# Patient Record
Sex: Male | Born: 1950 | Race: Black or African American | Hispanic: No | Marital: Married | Smoking: Never smoker
Health system: Southern US, Community
[De-identification: ages and names within clinical notes are randomized; demographics above are authoritative.]

## PROBLEM LIST (undated history)

## (undated) DIAGNOSIS — I10 Essential (primary) hypertension: Secondary | ICD-10-CM

---

## 2004-01-03 ENCOUNTER — Emergency Department (HOSPITAL_COMMUNITY): Admission: EM | Admit: 2004-01-03 | Discharge: 2004-01-03 | Payer: Self-pay | Admitting: Emergency Medicine

## 2004-07-27 ENCOUNTER — Ambulatory Visit: Payer: Self-pay | Admitting: Family Medicine

## 2004-08-24 ENCOUNTER — Ambulatory Visit: Payer: Self-pay | Admitting: Family Medicine

## 2004-09-21 ENCOUNTER — Ambulatory Visit: Payer: Self-pay | Admitting: Family Medicine

## 2004-10-23 ENCOUNTER — Ambulatory Visit: Payer: Self-pay | Admitting: Family Medicine

## 2004-11-30 ENCOUNTER — Ambulatory Visit: Payer: Self-pay | Admitting: Family Medicine

## 2004-12-14 ENCOUNTER — Ambulatory Visit: Payer: Self-pay | Admitting: Family Medicine

## 2005-02-01 ENCOUNTER — Ambulatory Visit: Payer: Self-pay | Admitting: Family Medicine

## 2005-11-14 ENCOUNTER — Ambulatory Visit: Payer: Self-pay | Admitting: Internal Medicine

## 2005-11-14 LAB — CONVERTED CEMR LAB
ALT: 21 units/L
AST: 21 units/L
Alkaline Phosphatase: 85 units/L
CO2: 23 meq/L
Cholesterol: 152 mg/dL
Creatinine, Ser: 0.81 mg/dL
Eosinophils Absolute: 0.1 10*3/uL
Eosinophils Relative: 1 %
HCT: 43.7 %
Lymphs Abs: 2.1 10*3/uL
MCV: 85.2 fL
Platelets: 263 10*3/uL
RBC count: 5.13 10*6/uL
Total Bilirubin: 0.3 mg/dL
Total CHOL/HDL Ratio: 2.4
VLDL: 17 mg/dL
WBC: 5.9 10*3/uL

## 2005-12-21 ENCOUNTER — Ambulatory Visit: Payer: Self-pay | Admitting: Internal Medicine

## 2006-01-11 ENCOUNTER — Encounter: Payer: Self-pay | Admitting: Internal Medicine

## 2006-01-11 DIAGNOSIS — E291 Testicular hypofunction: Secondary | ICD-10-CM

## 2006-01-11 DIAGNOSIS — I10 Essential (primary) hypertension: Secondary | ICD-10-CM | POA: Insufficient documentation

## 2006-01-11 DIAGNOSIS — M545 Low back pain: Secondary | ICD-10-CM

## 2006-01-30 ENCOUNTER — Ambulatory Visit: Payer: Self-pay | Admitting: Internal Medicine

## 2006-01-30 LAB — CONVERTED CEMR LAB: Hgb A1c MFr Bld: 8.4 %

## 2006-02-06 ENCOUNTER — Ambulatory Visit: Payer: Self-pay | Admitting: Internal Medicine

## 2006-02-06 LAB — CONVERTED CEMR LAB
Creatinine, Urine: 249.4 mg/dL
Microalb Creat Ratio: 8.9 mg/g (ref 0.0–30.0)
Microalb, Ur: 2.22 mg/dL — ABNORMAL HIGH (ref 0.00–1.89)

## 2006-02-19 ENCOUNTER — Ambulatory Visit: Payer: Self-pay | Admitting: Internal Medicine

## 2006-04-30 ENCOUNTER — Encounter (INDEPENDENT_AMBULATORY_CARE_PROVIDER_SITE_OTHER): Payer: Self-pay | Admitting: Internal Medicine

## 2006-10-04 ENCOUNTER — Ambulatory Visit: Payer: Self-pay | Admitting: Internal Medicine

## 2006-10-04 DIAGNOSIS — E1165 Type 2 diabetes mellitus with hyperglycemia: Secondary | ICD-10-CM

## 2006-10-04 LAB — CONVERTED CEMR LAB
Blood Glucose, Fingerstick: 435
Hgb A1c MFr Bld: 13.6 %

## 2006-10-07 ENCOUNTER — Encounter (INDEPENDENT_AMBULATORY_CARE_PROVIDER_SITE_OTHER): Payer: Self-pay | Admitting: Internal Medicine

## 2006-11-15 ENCOUNTER — Ambulatory Visit: Payer: Self-pay | Admitting: Internal Medicine

## 2006-11-17 LAB — CONVERTED CEMR LAB
Calcium: 9 mg/dL (ref 8.4–10.5)
Chloride: 103 meq/L (ref 96–112)
Cholesterol: 128 mg/dL (ref 0–200)
Creatinine, Ser: 0.91 mg/dL (ref 0.40–1.50)
HDL: 50 mg/dL (ref >39)
Triglycerides: 171 mg/dL — ABNORMAL HIGH (ref <150)

## 2006-11-18 ENCOUNTER — Telehealth (INDEPENDENT_AMBULATORY_CARE_PROVIDER_SITE_OTHER): Payer: Self-pay | Admitting: *Deleted

## 2006-11-19 ENCOUNTER — Encounter (INDEPENDENT_AMBULATORY_CARE_PROVIDER_SITE_OTHER): Payer: Self-pay | Admitting: Internal Medicine

## 2006-12-17 ENCOUNTER — Telehealth (INDEPENDENT_AMBULATORY_CARE_PROVIDER_SITE_OTHER): Payer: Self-pay | Admitting: Internal Medicine

## 2006-12-25 ENCOUNTER — Encounter (INDEPENDENT_AMBULATORY_CARE_PROVIDER_SITE_OTHER): Payer: Self-pay | Admitting: Internal Medicine

## 2007-04-11 ENCOUNTER — Encounter (INDEPENDENT_AMBULATORY_CARE_PROVIDER_SITE_OTHER): Payer: Self-pay | Admitting: Internal Medicine

## 2007-04-18 ENCOUNTER — Ambulatory Visit: Payer: Self-pay | Admitting: Internal Medicine

## 2007-04-21 LAB — CONVERTED CEMR LAB
ALT: 18 units/L (ref 0–53)
AST: 15 units/L (ref 0–37)
Albumin: 4.7 g/dL (ref 3.5–5.2)
Alkaline Phosphatase: 85 units/L (ref 39–117)
Basophils Absolute: 0 10*3/uL (ref 0.0–0.1)
Basophils Relative: 1 % (ref 0–1)
Eosinophils Absolute: 0 10*3/uL (ref 0.0–0.7)
Glucose, Bld: 262 mg/dL — ABNORMAL HIGH (ref 70–99)
LDL Cholesterol: 71 mg/dL (ref 0–99)
Lymphs Abs: 2.1 10*3/uL (ref 0.7–4.0)
MCV: 87.8 fL (ref 78.0–100.0)
Neutrophils Relative %: 53 % (ref 43–77)
Platelets: 239 10*3/uL (ref 150–400)
Potassium: 4.3 meq/L (ref 3.5–5.3)
RDW: 12.7 % (ref 11.5–15.5)
Sodium: 135 meq/L (ref 135–145)
Total Protein: 7.9 g/dL (ref 6.0–8.3)
WBC: 5.3 10*3/uL (ref 4.0–10.5)

## 2007-04-28 ENCOUNTER — Encounter (INDEPENDENT_AMBULATORY_CARE_PROVIDER_SITE_OTHER): Payer: Self-pay | Admitting: Internal Medicine

## 2007-09-24 ENCOUNTER — Ambulatory Visit: Payer: Self-pay | Admitting: Internal Medicine

## 2007-09-24 DIAGNOSIS — F524 Premature ejaculation: Secondary | ICD-10-CM

## 2007-09-24 LAB — CONVERTED CEMR LAB: Blood Glucose, Fingerstick: 334

## 2007-09-29 ENCOUNTER — Encounter (INDEPENDENT_AMBULATORY_CARE_PROVIDER_SITE_OTHER): Payer: Self-pay | Admitting: Internal Medicine

## 2007-09-29 LAB — CONVERTED CEMR LAB
ALT: 19 units/L (ref 0–53)
Alkaline Phosphatase: 84 units/L (ref 39–117)
BUN: 16 mg/dL (ref 6–23)
Basophils Relative: 1 % (ref 0–1)
Chloride: 103 meq/L (ref 96–112)
Cholesterol: 145 mg/dL (ref 0–200)
Creatinine, Ser: 0.89 mg/dL (ref 0.40–1.50)
Glucose, Bld: 314 mg/dL — ABNORMAL HIGH (ref 70–99)
Lymphs Abs: 2.3 10*3/uL (ref 0.7–4.0)
Monocytes Relative: 7 % (ref 3–12)
Neutro Abs: 2.6 10*3/uL (ref 1.7–7.7)
Neutrophils Relative %: 49 % (ref 43–77)
PSA: 1.48 ng/mL (ref 0.10–4.00)
Platelets: 220 10*3/uL (ref 150–400)
Potassium: 4.5 meq/L (ref 3.5–5.3)
RBC: 5.07 M/uL (ref 4.22–5.81)
Sodium: 138 meq/L (ref 135–145)
Total Bilirubin: 0.4 mg/dL (ref 0.3–1.2)
Total CHOL/HDL Ratio: 2.6
Triglycerides: 96 mg/dL (ref <150)
VLDL: 19 mg/dL (ref 0–40)
WBC: 5.4 10*3/uL (ref 4.0–10.5)

## 2007-10-08 ENCOUNTER — Encounter (INDEPENDENT_AMBULATORY_CARE_PROVIDER_SITE_OTHER): Payer: Self-pay | Admitting: Internal Medicine

## 2007-10-21 ENCOUNTER — Telehealth (INDEPENDENT_AMBULATORY_CARE_PROVIDER_SITE_OTHER): Payer: Self-pay | Admitting: Internal Medicine

## 2007-10-29 ENCOUNTER — Ambulatory Visit: Payer: Self-pay | Admitting: Internal Medicine

## 2007-12-09 ENCOUNTER — Telehealth (INDEPENDENT_AMBULATORY_CARE_PROVIDER_SITE_OTHER): Payer: Self-pay | Admitting: *Deleted

## 2007-12-26 ENCOUNTER — Ambulatory Visit: Payer: Self-pay | Admitting: Internal Medicine

## 2007-12-26 DIAGNOSIS — F528 Other sexual dysfunction not due to a substance or known physiological condition: Secondary | ICD-10-CM

## 2007-12-26 LAB — CONVERTED CEMR LAB
Creatinine, Urine: 154 mg/dL
Microalb, Ur: 1.01 mg/dL (ref 0.00–1.89)

## 2007-12-29 LAB — CONVERTED CEMR LAB
AST: 16 units/L (ref 0–37)
Alkaline Phosphatase: 81 units/L (ref 39–117)
BUN: 13 mg/dL (ref 6–23)
Basophils Relative: 1 % (ref 0–1)
Creatinine, Ser: 0.92 mg/dL (ref 0.40–1.50)
Eosinophils Absolute: 0 10*3/uL (ref 0.0–0.7)
Hemoglobin: 14.6 g/dL (ref 13.0–17.0)
MCHC: 33.6 g/dL (ref 30.0–36.0)
MCV: 86.8 fL (ref 78.0–100.0)
Monocytes Absolute: 0.4 10*3/uL (ref 0.1–1.0)
Monocytes Relative: 6 % (ref 3–12)
RBC: 5.01 M/uL (ref 4.22–5.81)

## 2008-01-20 ENCOUNTER — Ambulatory Visit: Payer: Self-pay | Admitting: Endocrinology

## 2008-02-18 ENCOUNTER — Ambulatory Visit: Payer: Self-pay | Admitting: Endocrinology

## 2008-02-18 DIAGNOSIS — E230 Hypopituitarism: Secondary | ICD-10-CM | POA: Insufficient documentation

## 2008-02-18 LAB — CONVERTED CEMR LAB
Iron: 69 ug/dL (ref 42–165)
Prolactin: 12.3 ng/mL
Testosterone: 127.45 ng/dL — ABNORMAL LOW (ref 350.00–890)
Transferrin: 250.7 mg/dL (ref >=212.0)

## 2008-02-26 ENCOUNTER — Encounter (INDEPENDENT_AMBULATORY_CARE_PROVIDER_SITE_OTHER): Payer: Self-pay | Admitting: *Deleted

## 2008-03-15 ENCOUNTER — Telehealth (INDEPENDENT_AMBULATORY_CARE_PROVIDER_SITE_OTHER): Payer: Self-pay | Admitting: *Deleted

## 2008-03-15 ENCOUNTER — Ambulatory Visit: Payer: Self-pay | Admitting: Endocrinology

## 2008-04-05 ENCOUNTER — Ambulatory Visit: Payer: Self-pay | Admitting: Endocrinology

## 2008-04-05 ENCOUNTER — Encounter: Admission: RE | Admit: 2008-04-05 | Discharge: 2008-04-05 | Payer: Self-pay | Admitting: Endocrinology

## 2008-04-05 DIAGNOSIS — R9409 Abnormal results of other function studies of central nervous system: Secondary | ICD-10-CM | POA: Insufficient documentation

## 2008-04-09 ENCOUNTER — Telehealth: Payer: Self-pay | Admitting: Endocrinology

## 2008-05-04 ENCOUNTER — Ambulatory Visit: Payer: Self-pay | Admitting: Endocrinology

## 2008-05-04 LAB — CONVERTED CEMR LAB: Testosterone: 218.32 ng/dL — ABNORMAL LOW (ref 350.00–890.00)

## 2008-06-10 ENCOUNTER — Telehealth (INDEPENDENT_AMBULATORY_CARE_PROVIDER_SITE_OTHER): Payer: Self-pay | Admitting: Internal Medicine

## 2008-06-22 ENCOUNTER — Ambulatory Visit: Payer: Self-pay | Admitting: Internal Medicine

## 2008-06-23 ENCOUNTER — Encounter (INDEPENDENT_AMBULATORY_CARE_PROVIDER_SITE_OTHER): Payer: Self-pay | Admitting: Internal Medicine

## 2008-06-23 LAB — CONVERTED CEMR LAB
Alkaline Phosphatase: 64 units/L (ref 39–117)
Creatinine, Ser: 0.9 mg/dL (ref 0.40–1.50)
Glucose, Bld: 129 mg/dL — ABNORMAL HIGH (ref 70–99)
HDL: 45 mg/dL (ref >39)
LDL Cholesterol: 66 mg/dL (ref 0–99)
Sodium: 143 meq/L (ref 135–145)
Total Bilirubin: 0.3 mg/dL (ref 0.3–1.2)
Total CHOL/HDL Ratio: 2.9
Total Protein: 7.5 g/dL (ref 6.0–8.3)
Triglycerides: 106 mg/dL (ref <150)
VLDL: 21 mg/dL (ref 0–40)

## 2008-09-16 ENCOUNTER — Telehealth (INDEPENDENT_AMBULATORY_CARE_PROVIDER_SITE_OTHER): Payer: Self-pay | Admitting: Internal Medicine

## 2008-09-24 ENCOUNTER — Telehealth (INDEPENDENT_AMBULATORY_CARE_PROVIDER_SITE_OTHER): Payer: Self-pay | Admitting: *Deleted

## 2008-09-29 ENCOUNTER — Encounter (INDEPENDENT_AMBULATORY_CARE_PROVIDER_SITE_OTHER): Payer: Self-pay | Admitting: Internal Medicine

## 2009-01-03 ENCOUNTER — Encounter (INDEPENDENT_AMBULATORY_CARE_PROVIDER_SITE_OTHER): Payer: Self-pay | Admitting: *Deleted

## 2010-02-05 ENCOUNTER — Encounter: Payer: Self-pay | Admitting: Endocrinology

## 2011-04-20 ENCOUNTER — Other Ambulatory Visit: Payer: Self-pay

## 2011-04-20 ENCOUNTER — Telehealth: Payer: Self-pay

## 2011-04-20 DIAGNOSIS — Z139 Encounter for screening, unspecified: Secondary | ICD-10-CM

## 2011-04-20 NOTE — Telephone Encounter (Signed)
Day of prep: Take 1/2 dose of metformin and lantus.

## 2011-04-20 NOTE — Telephone Encounter (Signed)
Gastroenterology Pre-Procedure Form  T/C from [place] at Dr. Renard Matter' office/ pt has no phone Rosalita Chessman gave me the info ( she asked pt each question)  Request Date: 04/20/2011       Requesting Physician: Dr. Renard Matter     PATIENT INFORMATION:  Dillon Obrien is a 61 y.o., male (DOB=[birth year]).  PROCEDURE: Procedure(s) requested: colonoscopy Procedure Reason: screening for colon cancer  PATIENT REVIEW QUESTIONS: The patient reports the following:   1. Diabetes Melitis: yes  2. Joint replacements in the past 12 months: no 3. Major health problems in the past 3 months: no 4. Has an artificial valve or MVP:no 5. Has been advised in past to take antibiotics in advance of a procedure like teeth cleaning: no}    MEDICATIONS & ALLERGIES:    Patient reports the following regarding taking any blood thinners:   Plavix? no Aspirin?no Coumadin?  no  Patient confirms/reports the following medications:  Current Outpatient Prescriptions  Medication Sig Dispense Refill  . hydrochlorothiazide (HYDRODIURIL) 25 MG tablet Take 25 mg by mouth daily.      . insulin glargine (LANTUS) 100 UNIT/ML injection Inject into the skin at bedtime. Pt uses 10 units at supper daily      . metFORMIN (GLUCOPHAGE-XR) 750 MG 24 hr tablet Take 750 mg by mouth 2 (two) times daily.        Patient confirms/reports the following allergies:  No Known Allergies  Patient is appropriate to schedule for requested procedure(s): yes  AUTHORIZATION INFORMATION Primary Insurance:   ID #:   Group #:  Pre-Cert / Auth required:  Pre-Cert / Auth #:   Secondary Insurance:   ID #:   Group #:  Pre-Cert / Auth required:  Pre-Cert / Auth #:   No orders of the defined types were placed in this encounter.    SCHEDULE INFORMATION: Procedure has been scheduled as follows:  Date: 05/21/2011     Time: 7:30  AM Location: [facility] Short Stay  This Gastroenterology Pre-Precedure Form is being routed to the following  provider(s) for review: R. Roetta Sessions, MD

## 2011-04-23 NOTE — Telephone Encounter (Signed)
Rx and instructions mailed.  

## 2011-05-01 NOTE — Progress Notes (Signed)
Mailed letter to pt to either call or come by to update his med list prior to his colonoscopy with Dr. Jena Gauss on 05/21/2011. ( He does not have a phone so that I can call him).

## 2011-05-14 ENCOUNTER — Telehealth: Payer: Self-pay

## 2011-05-14 NOTE — Telephone Encounter (Signed)
Dillon Obrien at Dr. Lorenso Courier office to update any changes for the pt prior to his procedure on 05/21/2011 with RMR. ( Pt does not have a pone. i mailed him a letter to drop by to update his info prior to his procedure). Per Rosalita Chessman, pt has not been in the their office since this was scheduled and she is not aware of any new changes in his medical history or medications.

## 2011-05-18 MED ORDER — SODIUM CHLORIDE 0.45 % IV SOLN: Freq: Once | INTRAVENOUS | Status: DC

## 2011-05-21 ENCOUNTER — Ambulatory Visit (HOSPITAL_COMMUNITY)
Admission: RE | Admit: 2011-05-21 | Discharge: 2011-05-21 | Disposition: A | Discharge status: Home Health | Payer: BC Managed Care – PPO | Source: Ambulatory Visit | Attending: Internal Medicine | Admitting: Internal Medicine

## 2011-05-21 ENCOUNTER — Encounter (HOSPITAL_COMMUNITY)
Admission: RE | Disposition: A | Discharge status: Home Health | Payer: Self-pay | Source: Ambulatory Visit | Attending: Internal Medicine

## 2011-05-21 ENCOUNTER — Encounter (HOSPITAL_COMMUNITY): Payer: Self-pay | Admitting: *Deleted

## 2011-05-21 DIAGNOSIS — K5730 Diverticulosis of large intestine without perforation or abscess without bleeding: Secondary | ICD-10-CM

## 2011-05-21 DIAGNOSIS — Z139 Encounter for screening, unspecified: Secondary | ICD-10-CM

## 2011-05-21 DIAGNOSIS — D126 Benign neoplasm of colon, unspecified: Secondary | ICD-10-CM

## 2011-05-21 DIAGNOSIS — E119 Type 2 diabetes mellitus without complications: Secondary | ICD-10-CM | POA: Insufficient documentation

## 2011-05-21 DIAGNOSIS — Z1211 Encounter for screening for malignant neoplasm of colon: Secondary | ICD-10-CM

## 2011-05-21 HISTORY — PX: COLONOSCOPY: SHX5424

## 2011-05-21 LAB — GLUCOSE, CAPILLARY: Glucose-Capillary: 140 mg/dL — ABNORMAL HIGH (ref 70–99)

## 2011-05-21 SURGERY — COLONOSCOPY
Anesthesia: Moderate Sedation

## 2011-05-21 MED ORDER — MEPERIDINE HCL 100 MG/ML IJ SOLN
INTRAMUSCULAR | Status: AC
  Filled 2011-05-21: qty 2

## 2011-05-21 MED ORDER — STERILE WATER FOR IRRIGATION IR SOLN
Status: DC | PRN
  Administered 2011-05-21: 08:00:00

## 2011-05-21 MED ORDER — MIDAZOLAM HCL 5 MG/5ML IJ SOLN
INTRAMUSCULAR | Status: DC | PRN
  Administered 2011-05-21: 1 mg via INTRAVENOUS
  Administered 2011-05-21: 2 mg via INTRAVENOUS
  Administered 2011-05-21: 1 mg via INTRAVENOUS

## 2011-05-21 MED ORDER — MIDAZOLAM HCL 5 MG/5ML IJ SOLN
INTRAMUSCULAR | Status: AC
  Filled 2011-05-21: qty 10

## 2011-05-21 MED ORDER — MEPERIDINE HCL 100 MG/ML IJ SOLN
INTRAMUSCULAR | Status: DC | PRN
  Administered 2011-05-21: 50 mg via INTRAVENOUS
  Administered 2011-05-21: 25 mg via INTRAVENOUS

## 2011-05-21 NOTE — Discharge Instructions (Signed)
Colonoscopy Discharge Instructions  Read the instructions outlined below and refer to this sheet in the next few weeks. These discharge instructions provide you with general information on caring for yourself after you leave the hospital. Your doctor may also give you specific instructions. While your treatment has been planned according to the most current medical practices available, unavoidable complications occasionally occur. If you have any problems or questions after discharge, call Dr. Rourk at [phone]. ACTIVITY  You may resume your regular activity, but move at a slower pace for the next 24 hours.   Take frequent rest periods for the next 24 hours.   Walking will help get rid of the air and reduce the bloated feeling in your belly (abdomen).   No driving for 24 hours (because of the medicine (anesthesia) used during the test).    Do not sign any important legal documents or operate any machinery for 24 hours (because of the anesthesia used during the test).  NUTRITION  Drink plenty of fluids.   You may resume your normal diet as instructed by your doctor.   Begin with a light meal and progress to your normal diet. Heavy or fried foods are harder to digest and may make you feel sick to your stomach (nauseated).   Avoid alcoholic beverages for 24 hours or as instructed.  MEDICATIONS  You may resume your normal medications unless your doctor tells you otherwise.  WHAT YOU CAN EXPECT TODAY  Some feelings of bloating in the abdomen.   Passage of more gas than usual.   Spotting of blood in your stool or on the toilet paper.  IF YOU HAD POLYPS REMOVED DURING THE COLONOSCOPY:  No aspirin products for 7 days or as instructed.   No alcohol for 7 days or as instructed.   Eat a soft diet for the next 24 hours.  FINDING OUT THE RESULTS OF YOUR TEST Not all test results are available during your visit. If your test results are not back during the visit, make an appointment  with your caregiver to find out the results. Do not assume everything is normal if you have not heard from your caregiver or the medical facility. It is important for you to follow up on all of your test results.  SEEK IMMEDIATE MEDICAL ATTENTION IF:  You have more than a spotting of blood in your stool.   Your belly is swollen (abdominal distention).   You are nauseated or vomiting.   You have a temperature over 101.   You have abdominal pain or discomfort that is severe or gets worse throughout the day.   Polyp and diverticulosis information provided.  Further recommendations to follow pending review of pathology report. Colon Polyps A polyp is extra tissue that grows inside your body. Colon polyps grow in the large intestine. The large intestine, also called the colon, is part of your digestive system. It is a long, hollow tube at the end of your digestive tract where your body makes and stores stool. Most polyps are not dangerous. They are benign. This means they are not cancerous. But over time, some types of polyps can turn into cancer. Polyps that are smaller than a pea are usually not harmful. But larger polyps could someday become or may already be cancerous. To be safe, doctors remove all polyps and test them.  WHO GETS POLYPS? Anyone can get polyps, but certain people are more likely than others. You may have a greater chance of getting polyps if:    You are over 50.   You have had polyps before.   Someone in your family has had polyps.   Someone in your family has had cancer of the large intestine.   Find out if someone in your family has had polyps. You may also be more likely to get polyps if you:   Eat a lot of fatty foods.   Smoke.   Drink alcohol.   Do not exercise.   Eat too much.  SYMPTOMS  Most small polyps do not cause symptoms. People often do not know they have one until their caregiver finds it during a regular checkup or while testing them for  something else. Some people do have symptoms like these:  Bleeding from the anus. You might notice blood on your underwear or on toilet paper after you have had a bowel movement.   Constipation or diarrhea that lasts more than a week.   Blood in the stool. Blood can make stool look black or it can show up as red streaks in the stool.  If you have any of these symptoms, see your caregiver. HOW DOES THE DOCTOR TEST FOR POLYPS? The doctor can use four tests to check for polyps:  Digital rectal exam. The caregiver wears gloves and checks your rectum (the last part of the large intestine) to see if it feels normal. This test would find polyps only in the rectum. Your caregiver may need to do one of the other tests listed below to find polyps higher up in the intestine.   Barium enema. The caregiver puts a liquid called barium into your rectum before taking x-rays of your large intestine. Barium makes your intestine look white in the pictures. Polyps are dark, so they are easy to see.   Sigmoidoscopy. With this test, the caregiver can see inside your large intestine. A thin flexible tube is placed into your rectum. The device is called a sigmoidoscope, which has a light and a tiny video camera in it. The caregiver uses the sigmoidoscope to look at the last third of your large intestine.   Colonoscopy. This test is like sigmoidoscopy, but the caregiver looks at all of the large intestine. It usually requires sedation. This is the most common method for finding and removing polyps.  TREATMENT   The caregiver will remove the polyp during sigmoidoscopy or colonoscopy. The polyp is then tested for cancer.   If you have had polyps, your caregiver may want you to get tested regularly in the future.  PREVENTION  There is not one sure way to prevent polyps. You might be able to lower your risk of getting them if you:  Eat more fruits and vegetables and less fatty food.   Do not smoke.   Avoid  alcohol.   Exercise every day.   Lose weight if you are overweight.   Eating more calcium and folate can also lower your risk of getting polyps. Some foods that are rich in calcium are milk, cheese, and broccoli. Some foods that are rich in folate are chickpeas, kidney beans, and spinach.   Aspirin might help prevent polyps. Studies are under way.  Document Released: 09/28/2003 Document Revised: 12/21/2010 Document Reviewed: 03/05/2007 Exit[employer] Patient Information 2012 [employer].  Diverticulosis Diverticulosis is a common condition that develops when small pouches (diverticula) form in the wall of the colon. The risk of diverticulosis increases with age. It happens more often in people who eat a low-fiber diet. Most individuals with diverticulosis have no symptoms.   Those individuals with symptoms usually experience abdominal pain, constipation, or loose stools (diarrhea). HOME CARE INSTRUCTIONS   Increase the amount of fiber in your diet as directed by your caregiver or dietician. This may reduce symptoms of diverticulosis.   Your caregiver may recommend taking a dietary fiber supplement.   Drink at least 6 to 8 glasses of water each day to prevent constipation.   Try not to strain when you have a bowel movement.   Your caregiver may recommend avoiding nuts and seeds to prevent complications, although this is still an uncertain benefit.   Only take over-the-counter or prescription medicines for pain, discomfort, or fever as directed by your caregiver.  FOODS WITH HIGH FIBER CONTENT INCLUDE:  Fruits. Apple, peach, pear, tangerine, raisins, prunes.   Vegetables. Brussels sprouts, asparagus, broccoli, cabbage, carrot, cauliflower, romaine lettuce, spinach, summer squash, tomato, winter squash, zucchini.   Starchy Vegetables. Baked beans, kidney beans, lima beans, split peas, lentils, potatoes (with skin).   Grains. Whole wheat bread, brown rice, bran flake cereal, plain oatmeal,  white rice, shredded wheat, bran muffins.  SEEK IMMEDIATE MEDICAL CARE IF:   You develop increasing pain or severe bloating.   You have an oral temperature above 102 F (38.9 C), not controlled by medicine.   You develop vomiting or bowel movements that are bloody or black.  Document Released: 09/29/2003 Document Revised: 12/21/2010 Document Reviewed: 06/01/2009 [employer] Patient Information 2012 [employer]. 

## 2011-05-21 NOTE — Op Note (Signed)
[facility] [address] [place], [place]  [address]  COLONOSCOPY PROCEDURE REPORT  PATIENT:  Dillon Obrien, Dillon Obrien  MR#:  [id number] BIRTHDATE:  [birth year], 60 yrs. old  GENDER:  male ENDOSCOPIST:  R. Roetta Sessions, MD FACP [facility] REF. BY:              Butch Penny, M.D. PROCEDURE DATE:  05/21/2011 PROCEDURE:  Colonoscopy with biopsy and snare polypectomy  INDICATIONS:  First-ever average risk screening colonoscopy  INFORMED CONSENT:  The risks, benefits, alternatives and imponderables including but not limited to bleeding, perforation as well as the possibility of a missed lesion have been reviewed. The potential for biopsy, lesion removal, etc. have also been discussed.  Questions have been answered.  All parties agreeable. Please see the history and physical in the medical record for more information.  MEDICATIONS:  Versed 4 mg IV and Demerol 75 mg IV in divided doses.  DESCRIPTION OF PROCEDURE:  After a digital rectal exam was performed, the EC-[id number] ([id number]) colonoscope was advanced from the anus through the rectum and colon to the area of the cecum, ileocecal valve and appendiceal orifice.  The cecum was deeply intubated.  These structures were well-seen and photographed for the record.  From the level of the cecum and ileocecal valve, the scope was slowly and cautiously withdrawn.  The mucosal surfaces were carefully surveyed utilizing scope tip deflection to facilitate fold flattening as needed.  The scope was pulled down into the rectum where a thorough examination including retroflexion was performed. <<PROCEDUREIMAGES>>  FINDINGS: Adequate preparation. Shallow, narrow mouth left sided diverticula; 2 diminutive polyps in the descending segment. One 5 mm pedunculated polyp in the same segment. The remainder of the colonic mucosa as well as the rectal mucosa appeared normal.  THERAPEUTIC / DIAGNOSTIC MANEUVERS PERFORMED: Polyps in the descending segment were  either cold biopsied or hot snare removed  COMPLICATIONS:  None  CECAL WITHDRAWAL TIME: 13 minutes  IMPRESSION:   Colonic diverticulosis. Colonic polyps-removed as described above.  RECOMMENDATIONS: Followup on pathology.  ______________________________ R. Roetta Sessions, MD Caleen Essex  CC:  Butch Penny, M.D.  n. eSIGNED:   R. Roetta Sessions at 05/21/2011 08:17 AM  Thomasene Lot, [phone]

## 2011-05-21 NOTE — Telephone Encounter (Signed)
noted 

## 2011-05-21 NOTE — H&P (Addendum)
  Primary Care Physician:  Alice Reichert, MD, MD Primary Gastroenterologist:  Dr. Jena Gauss  Pre-Procedure History & Physical: HPI:  Dillon Obrien is a 61 y.o. male is here for a screening colonoscopy.  No bowel symptoms. No prior colonoscopy. No family history of colon polyps or colon cancer.  Past Medical History  Diagnosis Date  . Diabetes mellitus     History reviewed. No pertinent past surgical history.  Prior to Admission medications   Medication Sig Start Date End Date Taking? Authorizing Provider  hydrochlorothiazide (HYDRODIURIL) 25 MG tablet Take 25 mg by mouth daily.   Yes Historical Provider, MD  insulin glargine (LANTUS) 100 UNIT/ML injection Inject into the skin at bedtime. Pt uses 10 units at supper daily   Yes Historical Provider, MD  metFORMIN (GLUCOPHAGE-XR) 750 MG 24 hr tablet Take 750 mg by mouth 2 (two) times daily.   Yes Historical Provider, MD    Allergies as of 04/20/2011  . (No Known Allergies)    History reviewed. No pertinent family history.  History   Social History  . Marital Status: Married    Spouse Name: N/A    Number of Children: N/A  . Years of Education: N/A   Occupational History  . Not on file.   Social History Main Topics  . Smoking status: Never Smoker   . Smokeless tobacco: Not on file  . Alcohol Use: No  . Drug Use: No  . Sexually Active:    Other Topics Concern  . Not on file   Social History Narrative  . No narrative on file    Review of Systems: See HPI, otherwise negative ROS  Physical Exam: BP 175/90  Pulse 66  Temp(Src) 97.5 F (36.4 C) (Oral)  Resp 19  Ht 5\' 10"  (1.778 m)  Wt 211 lb (95.709 kg)  BMI 30.28 kg/m2 General:   Alert,  Well-developed, well-nourished, pleasant and cooperative in NAD Head:  Normocephalic and atraumatic. Eyes:  Sclera clear, no icterus.   Conjunctiva pink. Ears:  Normal auditory acuity. Nose:  No deformity, discharge,  or lesions. Mouth:  No deformity or lesions, dentition  normal. Neck:  Supple; no masses or thyromegaly. Lungs:  Clear throughout to auscultation.   No wheezes, crackles, or rhonchi. No acute distress. Heart:  Regular rate and rhythm; no murmurs, clicks, rubs,  or gallops. Abdomen:  Soft, nontender and nondistended. No masses, hepatosplenomegaly or hernias noted. Normal bowel sounds, without guarding, and without rebound.   Msk:  Symmetrical without gross deformities. Normal posture. Pulses:  Normal pulses noted. Extremities:  Without clubbing or edema. Neurologic:  Alert and  oriented x4;  grossly normal neurologically. Skin:  Intact without significant lesions or rashes. Cervical Nodes:  No significant cervical adenopathy. Psych:  Alert and cooperative. Normal mood and affect.  Impression/Plan: Dillon Obrien is now here to undergo a screening colonoscopy.  First-ever average risk screening examination  Risks, benefits, limitations, imponderables and alternatives regarding colonoscopy have been reviewed with the patient. Questions have been answered. All parties agreeable.

## 2011-05-26 ENCOUNTER — Encounter: Payer: Self-pay | Admitting: Internal Medicine

## 2011-05-28 ENCOUNTER — Encounter (HOSPITAL_COMMUNITY): Payer: Self-pay | Admitting: Internal Medicine

## 2014-05-21 ENCOUNTER — Emergency Department (HOSPITAL_COMMUNITY): Payer: BLUE CROSS/BLUE SHIELD

## 2014-05-21 ENCOUNTER — Emergency Department (HOSPITAL_COMMUNITY)
Admission: EM | Admit: 2014-05-21 | Discharge: 2014-05-21 | Disposition: A | Discharge status: Home / Self Care | Payer: BLUE CROSS/BLUE SHIELD | Attending: Emergency Medicine | Admitting: Emergency Medicine

## 2014-05-21 ENCOUNTER — Encounter (HOSPITAL_COMMUNITY): Payer: Self-pay | Admitting: *Deleted

## 2014-05-21 DIAGNOSIS — R0600 Dyspnea, unspecified: Secondary | ICD-10-CM | POA: Diagnosis not present

## 2014-05-21 DIAGNOSIS — Z794 Long term (current) use of insulin: Secondary | ICD-10-CM | POA: Insufficient documentation

## 2014-05-21 DIAGNOSIS — I10 Essential (primary) hypertension: Secondary | ICD-10-CM

## 2014-05-21 DIAGNOSIS — E119 Type 2 diabetes mellitus without complications: Secondary | ICD-10-CM | POA: Insufficient documentation

## 2014-05-21 DIAGNOSIS — R0602 Shortness of breath: Secondary | ICD-10-CM | POA: Diagnosis present

## 2014-05-21 DIAGNOSIS — Z79899 Other long term (current) drug therapy: Secondary | ICD-10-CM | POA: Insufficient documentation

## 2014-05-21 HISTORY — DX: Essential (primary) hypertension: I10

## 2014-05-21 LAB — BASIC METABOLIC PANEL
ANION GAP: 7 (ref 5–15)
BUN: 19 mg/dL (ref 6–20)
CALCIUM: 8.3 mg/dL — AB (ref 8.9–10.3)
CO2: 26 mmol/L (ref 22–32)
CREATININE: 1.2 mg/dL (ref 0.61–1.24)
Chloride: 108 mmol/L (ref 101–111)
GLUCOSE: 144 mg/dL — AB (ref 70–99)
Potassium: 4.1 mmol/L (ref 3.5–5.1)
Sodium: 141 mmol/L (ref 135–145)

## 2014-05-21 LAB — CBC WITH DIFFERENTIAL/PLATELET
Basophils Absolute: 0 10*3/uL (ref 0.0–0.1)
Basophils Relative: 0 % (ref 0–1)
Eosinophils Absolute: 0.1 10*3/uL (ref 0.0–0.7)
Eosinophils Relative: 2 % (ref 0–5)
HEMATOCRIT: 44.8 % (ref 39.0–52.0)
HEMOGLOBIN: 14 g/dL (ref 13.0–17.0)
LYMPHS PCT: 27 % (ref 12–46)
Lymphs Abs: 1.8 10*3/uL (ref 0.7–4.0)
MCH: 26.9 pg (ref 26.0–34.0)
MCHC: 31.3 g/dL (ref 30.0–36.0)
MCV: 86 fL (ref 78.0–100.0)
MONO ABS: 0.6 10*3/uL (ref 0.1–1.0)
Monocytes Relative: 8 % (ref 3–12)
NEUTROS ABS: 4.2 10*3/uL (ref 1.7–7.7)
NEUTROS PCT: 63 % (ref 43–77)
Platelets: 147 10*3/uL — ABNORMAL LOW (ref 150–400)
RBC: 5.21 MIL/uL (ref 4.22–5.81)
RDW: 15.1 % (ref 11.5–15.5)
WBC: 6.7 10*3/uL (ref 4.0–10.5)

## 2014-05-21 LAB — I-STAT TROPONIN, ED: TROPONIN I, POC: 0.03 ng/mL (ref 0.00–0.08)

## 2014-05-21 LAB — BRAIN NATRIURETIC PEPTIDE: B NATRIURETIC PEPTIDE 5: 661 pg/mL — AB (ref 0.0–100.0)

## 2014-05-21 MED ORDER — IPRATROPIUM-ALBUTEROL 0.5-2.5 (3) MG/3ML IN SOLN
3.0000 mL | Freq: Once | RESPIRATORY_TRACT | Status: AC
  Administered 2014-05-21: 3 mL via RESPIRATORY_TRACT
  Filled 2014-05-21: qty 3

## 2014-05-21 NOTE — ED Notes (Signed)
Has felt sob since Jan.  And "heartburn"  No n/v,

## 2014-05-21 NOTE — ED Notes (Signed)
Pt was ambulated in hallway on room air while monitoring pulse oximetry. Pulse oximetry was 98% at the beginning of the walk, dropped to 93% during walk, and was back up to 96% once back in room. Pt tolerated well.

## 2014-05-21 NOTE — Discharge Instructions (Signed)

## 2014-05-21 NOTE — ED Provider Notes (Signed)
CSN: [id number]     Arrival date & time 05/21/14  1904 History   First MD Initiated Contact with Patient 05/21/14 1921     Chief Complaint  Patient presents with  . Shortness of Breath     Patient is a 64 y.o. male presenting with shortness of breath. The history is provided by the patient and the spouse.  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 Diabetes mellitus   . Hypertension    Past Surgical History  Procedure Laterality Date  . Colonoscopy  05/21/2011    Procedure: COLONOSCOPY;  Surgeon: Robert M Rourk, MD;  Location: [facility] SUITE;  Service: Endoscopy;  Laterality: N/A;  7:30 AM   History reviewed. No pertinent family history. History  Substance Use Topics  . Smoking status: Never Smoker   . Smokeless tobacco: Not on file  . Alcohol Use: No    Review of Systems  Constitutional: Negative for fever.  Respiratory: Positive for shortness of breath. Negative for cough.   Cardiovascular: Negative for chest pain, leg swelling and syncope.  Gastrointestinal: Negative for abdominal pain and blood in stool.  All other systems reviewed and are negative.     Allergies  Review of patient's allergies indicates no known allergies.  Home Medications   Prior to Admission medications   Medication Sig Start Date End  Date Taking? Authorizing Provider  cimetidine (TAGAMET) 200 MG tablet Take 200 mg by mouth daily as needed (FOR ACID REFLUX).   Yes Historical Provider, MD  insulin glargine (LANTUS) 100 UNIT/ML injection Inject 10 Units into the skin at bedtime.    Yes Historical Provider, MD  metFORMIN (GLUCOPHAGE-XR) 750 MG 24 hr tablet Take 750 mg by mouth 2 (two) times daily.   Yes Historical Provider, MD  Testosterone Cypionate 200 MG/ML KIT Inject into the muscle every 14 (fourteen) days.   Yes Historical Provider, MD   BP 164/101 mmHg  Pulse 98  Temp(Src) 97.9 F (36.6 C) (Oral)  Resp 22  Ht 5' 11" (1.803 m)  Wt 216 lb (97.977 kg)  BMI 30.14 kg/m2  SpO2 99% Physical Exam CONSTITUTIONAL: Well developed/well nourished HEAD: Normocephalic/atraumatic EYES: EOMI/PERRL ENMT: Mucous membranes moist NECK: supple no meningeal signs SPINE/BACK:entire spine nontender CV: S1/S2 noted, no murmurs/rubs/gallops noted LUNGS: Lungs are clear to auscultation bilaterally, no apparent distress ABDOMEN: soft, nontender, no rebound or guarding, bowel sounds noted throughout abdomen GU:no cva tenderness NEURO: Pt is awake/alert/appropriate, moves all extremitiesx4.  No facial droop.   EXTREMITIES: pulses normal/equal, full ROM, no LE edema noted SKIN: warm, color normal PSYCH: no abnormalities of mood noted, alert and oriented to situation  ED Course  Procedures  Labs Review Labs Reviewed  CBC WITH DIFFERENTIAL/PLATELET - Abnormal; Notable for the following:    Platelets 147 (*)      All other components within normal limits  BASIC METABOLIC PANEL - Abnormal; Notable for the following:    Glucose, Bld 144 (*)    Calcium 8.3 (*)    All other components within normal limits  BRAIN NATRIURETIC PEPTIDE - Abnormal; Notable for the following:    B Natriuretic Peptide 661.0 (*)    All other components within normal limits  I-STAT TROPOININ, ED    Imaging Review Dg Chest 2 View  05/21/2014   CLINICAL DATA:   Shortness of breath, heartburn, and nonproductive cough for 4-5 months.  EXAM: CHEST  2 VIEW  COMPARISON:  None.  FINDINGS: The cardiac silhouette is mildly enlarged. Moderate peribronchial thickening is present. No segmental airspace consolidation, overt pulmonary edema, pleural effusion, or pneumothorax is identified. No acute osseous abnormality is seen.  IMPRESSION: Bronchitic changes.   Electronically Signed   By: Logan Bores   On: 05/21/2014 20:16     EKG Interpretation   Date/Time:  Friday May 21 2014 19:54:48 EDT Ventricular Rate:  95 PR Interval:  125 QRS Duration: 84 QT Interval:  350 QTC Calculation: 440 R Axis:   36 Text Interpretation:  Sinus rhythm Biatrial enlargement Probable LVH with  secondary repol abnrm No previous ECGs available Abnormal ekg Confirmed by  Christy Gentles  MD, Elenore Rota ([id number]) on 05/21/2014 8:14:59 PM     Medications  ipratropium-albuterol (DUONEB) 0.5-2.5 (3) MG/3ML nebulizer solution 3 mL (3 mLs Nebulization Given 05/21/14 2030)   Pt feels much improved while in the ED He denies any acute worsening today - he denies CP today.  He denies any worsening SOB today He reports his family asked him to go to ED He is in no distress He ambulated without difficulty and no hypoxia I doubt acute CHF Given chronicity of symptoms, I doubt PE Also he had no acute CP to suggest ACS He has PCP f/u in 3 days He wants to continue to work but restrictions given We discussed strict return precautions Some of his symptoms at night may be sleep apnea related Pt also had reported occupational exposure for yrs which could contribute to these symptoms (pt with bi-atrial enlargement on EKG which is likely chronic) BP 154/96 mmHg  Pulse 94  Temp(Src) 97.7 F (36.5 C) (Oral)  Resp 18  Ht 5' 11" (1.803 m)  Wt 216 lb (97.977 kg)  BMI 30.14 kg/m2  SpO2 97%   MDM   Final diagnoses:  Dyspnea  Essential hypertension    Nursing notes including past medical history and  social history reviewed and considered in documentation xrays/imaging reviewed by myself and considered during evaluation Labs/vital reviewed myself and considered during evaluation     Ripley Fraise, MD 05/21/14 2225

## 2014-06-07 ENCOUNTER — Other Ambulatory Visit (HOSPITAL_COMMUNITY): Payer: Self-pay | Admitting: Respiratory Therapy

## 2014-06-07 DIAGNOSIS — G4730 Sleep apnea, unspecified: Secondary | ICD-10-CM

## 2014-06-30 ENCOUNTER — Ambulatory Visit: Payer: BLUE CROSS/BLUE SHIELD | Attending: Family Medicine | Admitting: Sleep Medicine

## 2014-06-30 DIAGNOSIS — G4730 Sleep apnea, unspecified: Secondary | ICD-10-CM | POA: Insufficient documentation

## 2014-07-08 NOTE — Sleep Study (Signed)
  [place] A. Merlene Laughter, MD     [email or web address]         NOCTURNAL POLYSOMNOGRAM    LOCATION: SLEEP LAB FACILITY: [facility]   PHYSICIAN: Jacek Colson A. Merlene Laughter, M.D.   DATE OF STUDY: 06/30/2014.   REFERRING PHYSICIAN: A McInnis.   INDICATIONS: Patient is 64 year old who presents with hypersomnia, snoring and insomnia.  MEDICATIONS:  Prior to Admission medications   Medication Sig Start Date End Date Taking? Authorizing Provider  cimetidine (TAGAMET) 200 MG tablet Take 200 mg by mouth daily as needed (FOR ACID REFLUX).    Historical Provider, MD  insulin glargine (LANTUS) 100 UNIT/ML injection Inject 10 Units into the skin at bedtime.     Historical Provider, MD  metFORMIN (GLUCOPHAGE-XR) 750 MG 24 hr tablet Take 750 mg by mouth 2 (two) times daily.    Historical Provider, MD  Testosterone Cypionate 200 MG/ML KIT Inject into the muscle every 14 (fourteen) days.    Historical Provider, MD      EPWORTH SLEEPINESS SCALE: 15.   BMI: 31.   ARCHITECTURAL SUMMARY: Total recording time was 397 minutes. Sleep efficiency 86 %. Sleep latency 22 minutes. REM latency 25 minutes. Stage NI 10 %, N2 51 % and N3 0 % and REM sleep 39 %.    RESPIRATORY DATA:  Baseline oxygen saturation is 98 %. The lowest saturation is 86 %. The diagnostic AHI is 28. The patient had a fair number of central sleep apnea during his diagnostic portion. There was a crescendo decrescendo component to these events consistent with Cheyne-Stokes breathing. Furthermore, the patient was placed on positive pressure as there is level starting from 5 and increased to 13 with marginal success. In fact, he tends to have more pronounced central events on higher pressures.   LIMB MOVEMENT SUMMARY: PLM index 0.   ELECTROCARDIOGRAM SUMMARY: Average heart rate is 76 with no significant dysrhythmias observed.   IMPRESSION:  1. 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 I recommend that the patient be placed on AutoPap 5-10. 2. Abnormal sleep architecture with pathologically early REM latency and increased REM density. 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   Thanks for this referral.  Vignesh Willert A. Merlene Laughter, M.D. Diplomat, Tax adviser of Sleep Medicine.

## 2015-03-09 ENCOUNTER — Encounter: Payer: Self-pay | Admitting: *Deleted

## 2015-03-09 ENCOUNTER — Ambulatory Visit: Payer: BLUE CROSS/BLUE SHIELD | Admitting: Orthopaedic Surgery

## 2017-09-25 ENCOUNTER — Ambulatory Visit (INDEPENDENT_AMBULATORY_CARE_PROVIDER_SITE_OTHER): Payer: 59 | Admitting: Urology

## 2017-09-25 DIAGNOSIS — N401 Enlarged prostate with lower urinary tract symptoms: Secondary | ICD-10-CM | POA: Diagnosis not present

## 2017-09-25 DIAGNOSIS — N5201 Erectile dysfunction due to arterial insufficiency: Secondary | ICD-10-CM | POA: Diagnosis not present

## 2017-11-06 ENCOUNTER — Ambulatory Visit: Payer: No Typology Code available for payment source | Admitting: Urology

## 2017-12-11 ENCOUNTER — Ambulatory Visit (INDEPENDENT_AMBULATORY_CARE_PROVIDER_SITE_OTHER): Payer: 59 | Admitting: Urology

## 2017-12-11 DIAGNOSIS — E291 Testicular hypofunction: Secondary | ICD-10-CM

## 2017-12-11 DIAGNOSIS — N401 Enlarged prostate with lower urinary tract symptoms: Secondary | ICD-10-CM

## 2017-12-11 DIAGNOSIS — N5201 Erectile dysfunction due to arterial insufficiency: Secondary | ICD-10-CM

## 2018-03-19 ENCOUNTER — Ambulatory Visit (INDEPENDENT_AMBULATORY_CARE_PROVIDER_SITE_OTHER): Payer: 59 | Admitting: Urology

## 2018-03-19 DIAGNOSIS — N5201 Erectile dysfunction due to arterial insufficiency: Secondary | ICD-10-CM

## 2018-03-19 DIAGNOSIS — E291 Testicular hypofunction: Secondary | ICD-10-CM

## 2018-03-19 DIAGNOSIS — N401 Enlarged prostate with lower urinary tract symptoms: Secondary | ICD-10-CM

## 2018-03-28 ENCOUNTER — Ambulatory Visit (INDEPENDENT_AMBULATORY_CARE_PROVIDER_SITE_OTHER): Payer: 59 | Admitting: Urology

## 2018-03-28 DIAGNOSIS — E291 Testicular hypofunction: Secondary | ICD-10-CM | POA: Diagnosis not present

## 2018-04-18 ENCOUNTER — Ambulatory Visit (INDEPENDENT_AMBULATORY_CARE_PROVIDER_SITE_OTHER): Payer: 59 | Admitting: Urology

## 2018-04-18 DIAGNOSIS — E291 Testicular hypofunction: Secondary | ICD-10-CM

## 2018-07-02 ENCOUNTER — Ambulatory Visit (INDEPENDENT_AMBULATORY_CARE_PROVIDER_SITE_OTHER): Payer: 59 | Admitting: Urology

## 2018-07-02 DIAGNOSIS — N401 Enlarged prostate with lower urinary tract symptoms: Secondary | ICD-10-CM

## 2018-07-02 DIAGNOSIS — F524 Premature ejaculation: Secondary | ICD-10-CM

## 2018-07-02 DIAGNOSIS — N5201 Erectile dysfunction due to arterial insufficiency: Secondary | ICD-10-CM

## 2018-07-02 DIAGNOSIS — E291 Testicular hypofunction: Secondary | ICD-10-CM

## 2018-10-01 ENCOUNTER — Ambulatory Visit: Payer: Self-pay | Admitting: Urology

## 2019-02-10 ENCOUNTER — Telehealth: Payer: Self-pay | Admitting: Urology

## 2019-02-10 NOTE — Telephone Encounter (Signed)
Left message. Need to know which prescription. Pt has several we prescribe.

## 2019-02-10 NOTE — Telephone Encounter (Signed)
Pt scheduled appt on 3/24, he asked if we could send in a prescription to [facility] until then.

## 2019-02-13 ENCOUNTER — Telehealth: Payer: Self-pay | Admitting: Urology

## 2019-02-13 ENCOUNTER — Other Ambulatory Visit: Payer: Self-pay

## 2019-02-13 MED ORDER — VARDENAFIL HCL 20 MG PO TABS: 20.0000 mg | ORAL_TABLET | Freq: Every day | ORAL | 3 refills | Status: AC | PRN

## 2019-02-13 NOTE — Telephone Encounter (Signed)
Prescription sent in to [facility]. Pt notified.

## 2019-02-13 NOTE — Telephone Encounter (Signed)
Patient stopped by office to request medication refill to [facility] for Levitra - what insurance prefers him to take per patient  Requesting phone call when called in   Thanks

## 2019-04-01 ENCOUNTER — Other Ambulatory Visit: Payer: Self-pay

## 2019-04-01 DIAGNOSIS — E291 Testicular hypofunction: Secondary | ICD-10-CM

## 2019-04-01 DIAGNOSIS — N401 Enlarged prostate with lower urinary tract symptoms: Secondary | ICD-10-CM

## 2019-04-02 LAB — COMPREHENSIVE METABOLIC PANEL
AG Ratio: 1.4 (calc) (ref 1.0–2.5)
ALT: 14 U/L (ref 9–46)
AST: 19 U/L (ref 10–35)
Albumin: 4.6 g/dL (ref 3.6–5.1)
Alkaline phosphatase (APISO): 74 U/L (ref 35–144)
BUN: 20 mg/dL (ref 7–25)
CO2: 27 mmol/L (ref 20–32)
Calcium: 9.7 mg/dL (ref 8.6–10.3)
Chloride: 106 mmol/L (ref 98–110)
Creat: 1.05 mg/dL (ref 0.70–1.25)
Globulin: 3.4 g/dL (calc) (ref 1.9–3.7)
Glucose, Bld: 123 mg/dL — ABNORMAL HIGH (ref 65–99)
Potassium: 4.8 mmol/L (ref 3.5–5.3)
Sodium: 141 mmol/L (ref 135–146)
Total Bilirubin: 0.4 mg/dL (ref 0.2–1.2)
Total Protein: 8 g/dL (ref 6.1–8.1)

## 2019-04-02 LAB — CBC
HCT: 38.7 % (ref 38.5–50.0)
Hemoglobin: 13.1 g/dL — ABNORMAL LOW (ref 13.2–17.1)
MCH: 29.6 pg (ref 27.0–33.0)
MCHC: 33.9 g/dL (ref 32.0–36.0)
MCV: 87.4 fL (ref 80.0–100.0)
MPV: 10.9 fL (ref 7.5–12.5)
Platelets: 216 10*3/uL (ref 140–400)
RBC: 4.43 10*6/uL (ref 4.20–5.80)
RDW: 13.1 % (ref 11.0–15.0)
WBC: 5.1 10*3/uL (ref 3.8–10.8)

## 2019-04-02 LAB — ESTRADIOL: Estradiol: 24 pg/mL (ref <=39)

## 2019-04-02 LAB — TESTOSTERONE TOTAL,FREE,BIO, MALES
Albumin: 4.6 g/dL (ref 3.6–5.1)
Sex Hormone Binding: 10 nmol/L — ABNORMAL LOW (ref 22–77)
Testosterone: 100 ng/dL — ABNORMAL LOW (ref 250–827)

## 2019-04-08 ENCOUNTER — Other Ambulatory Visit: Payer: Self-pay

## 2019-04-08 ENCOUNTER — Ambulatory Visit (INDEPENDENT_AMBULATORY_CARE_PROVIDER_SITE_OTHER): Payer: Medicare Other | Admitting: Urology

## 2019-04-08 ENCOUNTER — Encounter: Payer: Self-pay | Admitting: Urology

## 2019-04-08 VITALS — BP 121/75 | HR 85 | Temp 97.0°F | Ht 71.0 in | Wt 200.0 lb

## 2019-04-08 DIAGNOSIS — N138 Other obstructive and reflux uropathy: Secondary | ICD-10-CM | POA: Diagnosis not present

## 2019-04-08 DIAGNOSIS — N401 Enlarged prostate with lower urinary tract symptoms: Secondary | ICD-10-CM

## 2019-04-08 DIAGNOSIS — R351 Nocturia: Secondary | ICD-10-CM | POA: Diagnosis not present

## 2019-04-08 DIAGNOSIS — E291 Testicular hypofunction: Secondary | ICD-10-CM | POA: Diagnosis not present

## 2019-04-08 DIAGNOSIS — N5201 Erectile dysfunction due to arterial insufficiency: Secondary | ICD-10-CM | POA: Insufficient documentation

## 2019-04-08 MED ORDER — TESTOSTERONE CYPIONATE 200 MG/ML IM KIT: 200.0000 mg | PACK | INTRAMUSCULAR | 3 refills | Status: AC

## 2019-04-08 MED ORDER — ALFUZOSIN HCL ER 10 MG PO TB24: 10.0000 mg | ORAL_TABLET | Freq: Every day | ORAL | 3 refills | Status: DC

## 2019-04-08 NOTE — Progress Notes (Signed)
Urological Symptom Review  Patient is experiencing the following symptoms: Hard to postpone urination Erection problems (male only)   Review of Systems  Gastrointestinal (upper)  : Negative for upper GI symptoms  Gastrointestinal (lower) : Negative for lower GI symptoms  Constitutional : Negative for symptoms  Skin: Negative for skin symptoms  Eyes: Negative for eye symptoms  Ear/Nose/Throat : Negative for Ear/Nose/Throat symptoms  Hematologic/Lymphatic: Negative for Hematologic/Lymphatic symptoms  Cardiovascular : Negative for cardiovascular symptoms  Respiratory : Negative for respiratory symptoms  Endocrine: Negative for endocrine symptoms  Musculoskeletal: Negative for musculoskeletal symptoms  Neurological: Negative for neurological symptoms  Psychologic: Negative for psychiatric symptoms

## 2019-04-08 NOTE — Progress Notes (Signed)
04/08/2019 10:02 AM   Dillon Obrien [birth year] [id number]  Referring provider: No referring provider defined for this encounter.  fatigue  HPI: Dillon Obrien is a 62VP here for followup for BPH with nocturia, hypogonadism, and ED. He lost insurance and is off testosterone and trimix for ED.  Testosterone 100, estradiol 24, CMP and CBC normal. He is fatigued, ED is worse, premature ejaculation worse.  Nocturia stable at 1x on uroxatral.    PMH: Past Medical History:  Diagnosis Date  . Diabetes mellitus   . Hypertension     Surgical History: Past Surgical History:  Procedure Laterality Date  . COLONOSCOPY  05/21/2011   Procedure: COLONOSCOPY;  Surgeon: Daneil Dolin, MD;  [facility];  Service: Endoscopy;  Laterality: N/A;  7:30 AM    Home Medications:  Allergies as of 04/08/2019   No Known Allergies     Medication List       Accurate as of April 08, 2019 10:02 AM. If you have any questions, ask your nurse or doctor.        cimetidine 200 MG tablet Commonly known as: TAGAMET Take 200 mg by mouth daily as needed (FOR ACID REFLUX).   insulin glargine 100 UNIT/ML injection Commonly known as: LANTUS Inject 10 Units into the skin at bedtime.   metFORMIN 750 MG 24 hr tablet Commonly known as: GLUCOPHAGE-XR Take 750 mg by mouth 2 (two) times daily.   Testosterone Cypionate 200 MG/ML Kit Inject into the muscle every 14 (fourteen) days.   vardenafil 20 MG tablet Commonly known as: LEVITRA Take 1 tablet (20 mg total) by mouth daily as needed for erectile dysfunction.       Allergies: No Known Allergies  Family History: No family history on file.  Social History:  reports that he has never smoked. He has never used smokeless tobacco. He reports that he does not drink alcohol or use drugs.  ROS: All other review of systems were reviewed and are negative except what is noted above in HPI  Physical Exam: BP 121/75   Pulse 85   Temp (!) 97 F (36.1  C)   Ht '5\' 11"'  (1.803 m)   Wt 200 lb (90.7 kg)   BMI 27.89 kg/m   Constitutional:  Alert and oriented, No acute distress. HEENT: [place] AT, moist mucus membranes.  Trachea midline, no masses. Cardiovascular: No clubbing, cyanosis, or edema. Respiratory: Normal respiratory effort, no increased work of breathing. GI: Abdomen is soft, nontender, nondistended, no abdominal masses GU: No CVA tenderness Lymph: No cervical or inguinal lymphadenopathy. Skin: No rashes, bruises or suspicious lesions. Neurologic: Grossly intact, no focal deficits, moving all 4 extremities. Psychiatric: Normal mood and affect.  Laboratory Data: Lab Results  Component Value Date   WBC 5.1 04/01/2019   HGB 13.1 (L) 04/01/2019   HCT 38.7 04/01/2019   MCV 87.4 04/01/2019   PLT 216 04/01/2019    Lab Results  Component Value Date   CREATININE 1.05 04/01/2019    Lab Results  Component Value Date   PSA 2.64 12/26/2007   PSA 1.48 09/24/2007   PSA 5.92 11/14/2005    Lab Results  Component Value Date   TESTOSTERONE 100 (L) 04/01/2019    Lab Results  Component Value Date   HGBA1C 8.1 06/22/2008    Urinalysis No results found for: COLORURINE, APPEARANCEUR, LABSPEC, PHURINE, GLUCOSEU, HGBUR, BILIRUBINUR, KETONESUR, PROTEINUR, UROBILINOGEN, NITRITE, LEUKOCYTESUR  No results found for: LABMICR, [place], RBCUA, LABEPIT, MUCUS, BACTERIA  Pertinent Imaging:  none No results found for this or any previous visit. No results found for this or any previous visit. No results found for this or any previous visit. No results found for this or any previous visit. No results found for this or any previous visit. No results found for this or any previous visit. No results found for this or any previous visit. No results found for this or any previous visit.  Assessment & Plan:    1. Benign prostatic hyperplasia with urinary obstruction -continue uroxatral  2. Erectile dysfunction due to arterial  insufficiency -trimix refilled  3. Hypogonadism male -restart IM testosteron 218m q 2 weeks -RTC 3 months with labs  4. Nocturia Continue uroxatral   No follow-ups on file.  PNicolette Bang MD  [facility]

## 2019-04-08 NOTE — Patient Instructions (Signed)

## 2019-04-13 ENCOUNTER — Telehealth: Payer: Self-pay

## 2019-04-13 NOTE — Telephone Encounter (Signed)
Pt notified of results

## 2019-04-13 NOTE — Telephone Encounter (Signed)
-----   Message from Cleon Gustin, MD sent at 04/13/2019  7:51 AM EDT ----- Labs normal except testosterone low at 100 ----- Message ----- From: Dorisann Frames, RN Sent: 04/02/2019   1:18 PM EDT To: Cleon Gustin, MD  Please review

## 2019-04-16 ENCOUNTER — Telehealth: Payer: Self-pay | Admitting: Urology

## 2019-04-16 NOTE — Telephone Encounter (Signed)
Patient states Dillon Obrien did not get his prescription for the injections to help with an erection.

## 2019-04-20 ENCOUNTER — Other Ambulatory Visit: Payer: Self-pay

## 2019-04-20 NOTE — Telephone Encounter (Signed)
Can you please send  in the trimax that was in your note. Medication was never ordered.

## 2019-04-20 NOTE — Progress Notes (Signed)
Opened in error

## 2019-04-22 NOTE — Telephone Encounter (Signed)
Pt states that the injections are not covered by insurance. He asks if he can get the kind that you put on the arm?

## 2019-04-22 NOTE — Telephone Encounter (Signed)
Correct trimix is not covered by insurance. What injects does he want in his arm? Testosterone?

## 2019-04-22 NOTE — Telephone Encounter (Signed)
He said he wants to try testosterone that can be rubbed on the arm.

## 2019-04-22 NOTE — Telephone Encounter (Signed)
Please see pt request below.

## 2019-04-24 NOTE — Telephone Encounter (Signed)
Pt called back this morning and asked if we had heard any update on his medication issue.

## 2019-04-24 NOTE — Telephone Encounter (Signed)
I spoke with pt this am about medication change request. Informed pt Dr. Alyson Ingles would be unavailable until  Next week. Pt voiced understanding and will wait for new rx next week.

## 2019-04-29 ENCOUNTER — Other Ambulatory Visit: Payer: Self-pay | Admitting: Urology

## 2019-04-29 MED ORDER — TESTOSTERONE 20.25 MG/1.25GM (1.62%) TD GEL: 1.0000 | Freq: Every day | TRANSDERMAL | 1 refills | Status: DC

## 2019-04-29 NOTE — Telephone Encounter (Signed)
Ok rx sent.

## 2019-05-01 ENCOUNTER — Telehealth: Payer: Self-pay | Admitting: Urology

## 2019-05-01 NOTE — Telephone Encounter (Signed)
Spoke with pharmacy. Androgel come sin 75g not 40g. Approval given to dispense 75g bottle

## 2019-05-01 NOTE — Telephone Encounter (Signed)
[facility] asked for a call back in regards to the recent rx that was sent in for this pt.

## 2019-07-01 DIAGNOSIS — H2522 Age-related cataract, morgagnian type, left eye: Secondary | ICD-10-CM | POA: Diagnosis not present

## 2019-07-01 DIAGNOSIS — H2512 Age-related nuclear cataract, left eye: Secondary | ICD-10-CM | POA: Diagnosis not present

## 2019-07-08 ENCOUNTER — Ambulatory Visit: Payer: Medicare Other | Admitting: Urology

## 2019-07-14 DIAGNOSIS — H2511 Age-related nuclear cataract, right eye: Secondary | ICD-10-CM | POA: Diagnosis not present

## 2019-07-14 DIAGNOSIS — E113413 Type 2 diabetes mellitus with severe nonproliferative diabetic retinopathy with macular edema, bilateral: Secondary | ICD-10-CM | POA: Diagnosis not present

## 2019-07-14 DIAGNOSIS — Z961 Presence of intraocular lens: Secondary | ICD-10-CM | POA: Diagnosis not present

## 2019-07-31 DIAGNOSIS — H26492 Other secondary cataract, left eye: Secondary | ICD-10-CM | POA: Diagnosis not present

## 2019-07-31 DIAGNOSIS — H25041 Posterior subcapsular polar age-related cataract, right eye: Secondary | ICD-10-CM | POA: Diagnosis not present

## 2019-09-23 DIAGNOSIS — H26492 Other secondary cataract, left eye: Secondary | ICD-10-CM | POA: Diagnosis not present

## 2019-10-27 DIAGNOSIS — I10 Essential (primary) hypertension: Secondary | ICD-10-CM | POA: Diagnosis not present

## 2019-10-27 DIAGNOSIS — E1165 Type 2 diabetes mellitus with hyperglycemia: Secondary | ICD-10-CM | POA: Diagnosis not present

## 2019-10-27 DIAGNOSIS — E785 Hyperlipidemia, unspecified: Secondary | ICD-10-CM | POA: Diagnosis not present

## 2019-10-27 DIAGNOSIS — Z9119 Patient's noncompliance with other medical treatment and regimen: Secondary | ICD-10-CM | POA: Diagnosis not present

## 2019-10-27 DIAGNOSIS — Z79899 Other long term (current) drug therapy: Secondary | ICD-10-CM | POA: Diagnosis not present

## 2019-12-21 DIAGNOSIS — E1165 Type 2 diabetes mellitus with hyperglycemia: Secondary | ICD-10-CM | POA: Diagnosis not present

## 2019-12-21 DIAGNOSIS — Z7984 Long term (current) use of oral hypoglycemic drugs: Secondary | ICD-10-CM | POA: Diagnosis not present

## 2019-12-21 DIAGNOSIS — E559 Vitamin D deficiency, unspecified: Secondary | ICD-10-CM | POA: Diagnosis not present

## 2019-12-21 DIAGNOSIS — E1136 Type 2 diabetes mellitus with diabetic cataract: Secondary | ICD-10-CM | POA: Diagnosis not present

## 2019-12-21 DIAGNOSIS — Z961 Presence of intraocular lens: Secondary | ICD-10-CM | POA: Diagnosis not present

## 2019-12-21 DIAGNOSIS — E785 Hyperlipidemia, unspecified: Secondary | ICD-10-CM | POA: Diagnosis not present

## 2019-12-21 DIAGNOSIS — H2511 Age-related nuclear cataract, right eye: Secondary | ICD-10-CM | POA: Diagnosis not present

## 2019-12-21 DIAGNOSIS — Z794 Long term (current) use of insulin: Secondary | ICD-10-CM | POA: Diagnosis not present

## 2019-12-21 DIAGNOSIS — E113413 Type 2 diabetes mellitus with severe nonproliferative diabetic retinopathy with macular edema, bilateral: Secondary | ICD-10-CM | POA: Diagnosis not present

## 2019-12-21 DIAGNOSIS — Z79899 Other long term (current) drug therapy: Secondary | ICD-10-CM | POA: Diagnosis not present

## 2019-12-21 DIAGNOSIS — I10 Essential (primary) hypertension: Secondary | ICD-10-CM | POA: Diagnosis not present

## 2019-12-23 DIAGNOSIS — Z7984 Long term (current) use of oral hypoglycemic drugs: Secondary | ICD-10-CM | POA: Diagnosis not present

## 2019-12-23 DIAGNOSIS — Z79899 Other long term (current) drug therapy: Secondary | ICD-10-CM | POA: Diagnosis not present

## 2019-12-23 DIAGNOSIS — E785 Hyperlipidemia, unspecified: Secondary | ICD-10-CM | POA: Diagnosis not present

## 2019-12-23 DIAGNOSIS — E1165 Type 2 diabetes mellitus with hyperglycemia: Secondary | ICD-10-CM | POA: Diagnosis not present

## 2019-12-23 DIAGNOSIS — I10 Essential (primary) hypertension: Secondary | ICD-10-CM | POA: Diagnosis not present

## 2020-01-13 DIAGNOSIS — H25041 Posterior subcapsular polar age-related cataract, right eye: Secondary | ICD-10-CM | POA: Diagnosis not present

## 2020-01-13 DIAGNOSIS — H25812 Combined forms of age-related cataract, left eye: Secondary | ICD-10-CM | POA: Diagnosis not present

## 2020-01-13 DIAGNOSIS — H2511 Age-related nuclear cataract, right eye: Secondary | ICD-10-CM | POA: Diagnosis not present

## 2020-01-26 ENCOUNTER — Ambulatory Visit
Admission: EM | Admit: 2020-01-26 | Discharge: 2020-01-26 | Disposition: A | Discharge status: Home / Self Care | Payer: Medicare Other | Attending: Emergency Medicine | Admitting: Emergency Medicine

## 2020-01-26 ENCOUNTER — Other Ambulatory Visit: Payer: Self-pay

## 2020-01-26 DIAGNOSIS — Z1152 Encounter for screening for COVID-19: Secondary | ICD-10-CM

## 2020-01-26 NOTE — ED Triage Notes (Signed)
Needs covid test

## 2020-01-28 LAB — COVID-19, FLU A+B NAA
Influenza A, NAA: NOT DETECTED
Influenza B, NAA: NOT DETECTED
SARS-CoV-2, NAA: NOT DETECTED

## 2020-05-05 ENCOUNTER — Other Ambulatory Visit: Payer: Self-pay

## 2020-05-05 ENCOUNTER — Encounter (HOSPITAL_COMMUNITY): Payer: Self-pay

## 2020-05-05 ENCOUNTER — Other Ambulatory Visit (HOSPITAL_COMMUNITY): Payer: Self-pay

## 2020-05-05 ENCOUNTER — Ambulatory Visit (HOSPITAL_COMMUNITY)
Admission: RE | Admit: 2020-05-05 | Discharge: 2020-05-05 | Disposition: A | Discharge status: Home / Self Care | Payer: Medicare Other | Source: Ambulatory Visit | Attending: Nurse Practitioner | Admitting: Nurse Practitioner

## 2020-05-05 DIAGNOSIS — M25551 Pain in right hip: Secondary | ICD-10-CM

## 2020-05-05 DIAGNOSIS — M545 Low back pain, unspecified: Secondary | ICD-10-CM

## 2020-05-06 ENCOUNTER — Other Ambulatory Visit (HOSPITAL_COMMUNITY): Payer: Self-pay | Admitting: Nurse Practitioner

## 2020-05-06 ENCOUNTER — Other Ambulatory Visit (HOSPITAL_COMMUNITY): Payer: Self-pay | Admitting: Internal Medicine

## 2020-05-06 ENCOUNTER — Ambulatory Visit (HOSPITAL_COMMUNITY)
Admission: RE | Admit: 2020-05-06 | Discharge: 2020-05-06 | Disposition: A | Discharge status: Home / Self Care | Payer: Medicare Other | Source: Ambulatory Visit | Attending: Internal Medicine | Admitting: Internal Medicine

## 2020-05-06 DIAGNOSIS — M25551 Pain in right hip: Secondary | ICD-10-CM

## 2020-05-06 DIAGNOSIS — M545 Low back pain, unspecified: Secondary | ICD-10-CM

## 2020-09-13 ENCOUNTER — Encounter: Payer: Self-pay | Admitting: Internal Medicine

## 2020-10-17 ENCOUNTER — Ambulatory Visit: Payer: Medicare Other

## 2021-02-15 ENCOUNTER — Ambulatory Visit (INDEPENDENT_AMBULATORY_CARE_PROVIDER_SITE_OTHER): Payer: Medicare Other | Admitting: Urology

## 2021-02-15 ENCOUNTER — Other Ambulatory Visit: Payer: Self-pay

## 2021-02-15 ENCOUNTER — Encounter: Payer: Self-pay | Admitting: Urology

## 2021-02-15 VITALS — BP 171/71 | HR 63 | Wt 200.0 lb

## 2021-02-15 DIAGNOSIS — E291 Testicular hypofunction: Secondary | ICD-10-CM

## 2021-02-15 DIAGNOSIS — N401 Enlarged prostate with lower urinary tract symptoms: Secondary | ICD-10-CM

## 2021-02-15 DIAGNOSIS — N5201 Erectile dysfunction due to arterial insufficiency: Secondary | ICD-10-CM

## 2021-02-15 DIAGNOSIS — R351 Nocturia: Secondary | ICD-10-CM

## 2021-02-15 DIAGNOSIS — N138 Other obstructive and reflux uropathy: Secondary | ICD-10-CM

## 2021-02-15 LAB — URINALYSIS, ROUTINE W REFLEX MICROSCOPIC
Bilirubin, UA: NEGATIVE
Glucose, UA: NEGATIVE
Ketones, UA: NEGATIVE
Leukocytes,UA: NEGATIVE
Nitrite, UA: NEGATIVE
Protein,UA: NEGATIVE
Specific Gravity, UA: 1.025 (ref 1.005–1.030)
Urobilinogen, Ur: 0.2 mg/dL (ref 0.2–1.0)
pH, UA: 5.5 (ref 5.0–7.5)

## 2021-02-15 LAB — MICROSCOPIC EXAMINATION
Bacteria, UA: NONE SEEN
Renal Epithel, UA: NONE SEEN /hpf
WBC, UA: NONE SEEN /hpf (ref 0–5)

## 2021-02-15 MED ORDER — PAROXETINE HCL 10 MG PO TABS: 10.0000 mg | ORAL_TABLET | Freq: Every day | ORAL | 3 refills | Status: DC

## 2021-02-15 MED ORDER — AMBULATORY NON FORMULARY MEDICATION: 0.2000 mL | 5 refills | Status: DC | PRN

## 2021-02-15 NOTE — Addendum Note (Signed)
Addended by: Iris Pert on: 02/15/2021 04:00 PM   Modules accepted: Orders

## 2021-02-15 NOTE — Patient Instructions (Signed)
Erectile Dysfunction °Erectile dysfunction (ED) is the inability to get or keep an erection in order to have sexual intercourse. ED is considered a symptom of an underlying disorder and is not considered a disease. ED may include: °Inability to get an erection. °Lack of enough hardness of the erection to allow penetration. °Loss of erection before sex is finished. °What are the causes? °This condition may be caused by: °Physical causes, such as: °Artery problems. This may include heart disease, high blood pressure, atherosclerosis, and diabetes. °Hormonal problems, such as low testosterone. °Obesity. °Nerve problems. This may include back or pelvic injuries, multiple sclerosis, Parkinson's disease, spinal cord injury, and stroke. °Certain medicines, such as: °Pain relievers. °Antidepressants. °Blood pressure medicines and water pills (diuretics). °Cancer medicines. °Antihistamines. °Muscle relaxants. °Lifestyle factors, such as: °Use of drugs such as marijuana, cocaine, or opioids. °Excessive use of alcohol. °Smoking. °Lack of physical activity or exercise. °Psychological causes, such as: °Anxiety or stress. °Sadness or depression. °Exhaustion. °Fear about sexual performance. °Guilt. °What are the signs or symptoms? °Symptoms of this condition include: °Inability to get an erection. °Lack of enough hardness of the erection to allow penetration. °Loss of the erection before sex is finished. °Sometimes having normal erections, but with frequent unsatisfactory episodes. °Low sexual satisfaction in either partner due to erection problems. °A curved penis occurring with erection. The curve may cause pain, or the penis may be too curved to allow for intercourse. °Never having nighttime or morning erections. °How is this diagnosed? °This condition is often diagnosed by: °Performing a physical exam to find other diseases or specific problems with the penis. °Asking you detailed questions about the problem. °Doing tests,  such as: °Blood tests to check for diabetes mellitus or high cholesterol, or to measure hormone levels. °Other tests to check for underlying health conditions. °An ultrasound exam to check for scarring. °A test to check blood flow to the penis. °Doing a sleep study at home to measure nighttime erections. °How is this treated? °This condition may be treated by: °Medicines, such as: °Medicine taken by mouth to help you achieve an erection (oral medicine). °Hormone replacement therapy to replace low testosterone levels. °Medicine that is injected into the penis. Your health care provider may instruct you how to give yourself these injections at home. °Medicine that is delivered with a short applicator tube. The tube is inserted into the opening at the tip of the penis, which is the opening of the urethra. A tiny pellet of medicine is put in the urethra. The pellet dissolves and enhances erectile function. This is also called MUSE (medicated urethral system for erections) therapy. °Vacuum pump. This is a pump with a ring on it. The pump and ring are placed on the penis and used to create pressure that helps the penis become erect. °Penile implant surgery. In this procedure, you may receive: °An inflatable implant. This consists of cylinders, a pump, and a reservoir. The cylinders can be inflated with a fluid that helps to create an erection, and they can be deflated after intercourse. °A semi-rigid implant. This consists of two silicone rubber rods. The rods provide some rigidity. They are also flexible, so the penis can both curve downward in its normal position and become straight for sexual intercourse. °Blood vessel surgery to improve blood flow to the penis. During this procedure, a blood vessel from a different part of the body is placed into the penis to allow blood to flow around (bypass) damaged or blocked blood vessels. °Lifestyle changes,   such as exercising more, losing weight, and quitting smoking. °Follow  these instructions at home: °Medicines ° °Take over-the-counter and prescription medicines only as told by your health care provider. Do not increase the dosage without first discussing it with your health care provider. °If you are using self-injections, do injections as directed by your health care provider. Make sure you avoid any veins that are on the surface of the penis. After giving an injection, apply pressure to the injection site for 5 minutes. °Talk to your health care provider about how to prevent headaches while taking ED medicines. These medicines may cause a sudden headache due to the increase in blood flow in your body. °General instructions °Exercise regularly, as directed by your health care provider. Work with your health care provider to lose weight, if needed. °Do not use any products that contain nicotine or tobacco. These products include cigarettes, chewing tobacco, and vaping devices, such as e-cigarettes. If you need help quitting, ask your health care provider. °Before using a vacuum pump, read the instructions that come with the pump and discuss any questions with your health care provider. °Keep all follow-up visits. This is important. °Contact a health care provider if: °You feel nauseous. °You are vomiting. °You get sudden headaches while taking ED medicines. °You have any concerns about your sexual health. °Get help right away if: °You are taking oral or injectable medicines and you have an erection that lasts longer than 4 hours. If your health care provider is unavailable, go to the nearest emergency room for evaluation. An erection that lasts much longer than 4 hours can result in permanent damage to your penis. °You have severe pain in your groin or abdomen. °You develop redness or severe swelling of your penis. °You have redness spreading at your groin or lower abdomen. °You are unable to urinate. °You experience chest pain or a rapid heartbeat (palpitations) after taking oral  medicines. °These symptoms may represent a serious problem that is an emergency. Do not wait to see if the symptoms will go away. Get medical help right away. Call your local emergency services (911 in the [place] Do not drive yourself to the hospital. °Summary °Erectile dysfunction (ED) is the inability to get or keep an erection during sexual intercourse. °This condition is diagnosed based on a physical exam, your symptoms, and tests to determine the cause. Treatment varies depending on the cause and may include medicines, hormone therapy, surgery, or a vacuum pump. °You may need follow-up visits to make sure that you are using your medicines or devices correctly. °Get help right away if you are taking or injecting medicines and you have an erection that lasts longer than 4 hours. °This information is not intended to replace advice given to you by your health care provider. Make sure you discuss any questions you have with your health care provider. °Document Revised: 03/30/2020 Document Reviewed: 03/30/2020 °Elsevier Patient Education © 2022 [employer] Inc. ° °

## 2021-02-15 NOTE — Progress Notes (Signed)
02/15/2021 3:50 PM   Barnie Del [birth year] [id number]  Referring provider: Jani Gravel, MD [address] [place],  [place] [address]  Followup BPH, erectile dysfunction and hypogonadism   HPI: Mr Dicenzo is a 71yo here for followup for erectile dysfunction, BPH with nocturia, hypogonadism. IPSS 12, QOL 2 on flomax 0.70m daily. Urine stream strong. Nocturia 1-3x.  He continues to have issues getting and maintaining an erection. He has tried sildenafil, tadalafil, vardenafil with poor results. He previously used trimix which worked well. He is back on his IM testosterone and recently increased his dosage to 2030mevery 2 weeks. He has issues with premature ejaculation. His latency is 30-60 seconds. He is frustrated by both his erectile dysfunction and premature ejaculation. No other complaints today   PMH: Past Medical History:  Diagnosis Date   Diabetes mellitus    Hypertension     Surgical History: Past Surgical History:  Procedure Laterality Date   COLONOSCOPY  05/21/2011   Procedure: COLONOSCOPY;  Surgeon: RoDaneil DolinMD;  [facility];  Service: Endoscopy;  Laterality: N/A;  7:30 AM    Home Medications:  Allergies as of 02/15/2021   No Known Allergies      Medication List        Accurate as of February 15, 2021  3:50 PM. If you have any questions, ask your nurse or doctor.          alfuzosin 10 MG 24 hr tablet Commonly known as: UROXATRAL Take 1 tablet (10 mg total) by mouth daily with breakfast.   amLODipine 10 MG tablet Commonly known as: NORVASC Take 10 mg by mouth daily.   cimetidine 200 MG tablet Commonly known as: TAGAMET Take 200 mg by mouth daily as needed (FOR ACID REFLUX).   insulin glargine 100 UNIT/ML injection Commonly known as: LANTUS Inject 10 Units into the skin at bedtime.   lisinopril 40 MG tablet Commonly known as: ZESTRIL Take 40 mg by mouth daily.   metFORMIN 750 MG 24 hr tablet Commonly known as:  GLUCOPHAGE-XR Take 750 mg by mouth 2 (two) times daily.   metFORMIN 500 MG tablet Commonly known as: GLUCOPHAGE Take 500 mg by mouth 2 (two) times daily.   tamsulosin 0.4 MG Caps capsule Commonly known as: FLOMAX Take 0.4 mg by mouth daily.   Testosterone 20.25 MG/1.25GM (1.62%) Gel Commonly known as: AndroGel Apply 1 Pump topically daily.   Testosterone Cypionate 200 MG/ML Kit Inject 200 mg into the muscle every 14 (fourteen) days.   vardenafil 20 MG tablet Commonly known as: LEVITRA Take 1 tablet (20 mg total) by mouth daily as needed for erectile dysfunction.        Allergies: No Known Allergies  Family History: No family history on file.  Social History:  reports that he has never smoked. He has never used smokeless tobacco. He reports that he does not drink alcohol and does not use drugs.  ROS: All other review of systems were reviewed and are negative except what is noted above in HPI  Physical Exam: BP (!) 171/71    Pulse 63    Wt 200 lb (90.7 kg)    BMI 27.89 kg/m   Constitutional:  Alert and oriented, No acute distress. HEENT: [place] AT, moist mucus membranes.  Trachea midline, no masses. Cardiovascular: No clubbing, cyanosis, or edema. Respiratory: Normal respiratory effort, no increased work of breathing. GI: Abdomen is soft, nontender, nondistended, no abdominal masses GU: No CVA tenderness.  Lymph: No cervical or inguinal lymphadenopathy. Skin: No rashes, bruises or suspicious lesions. Neurologic: Grossly intact, no focal deficits, moving all 4 extremities. Psychiatric: Normal mood and affect.  Laboratory Data: Lab Results  Component Value Date   WBC 5.1 04/01/2019   HGB 13.1 (L) 04/01/2019   HCT 38.7 04/01/2019   MCV 87.4 04/01/2019   PLT 216 04/01/2019    Lab Results  Component Value Date   CREATININE 1.05 04/01/2019    Lab Results  Component Value Date   PSA 2.64 12/26/2007   PSA 1.48 09/24/2007   PSA 5.92 11/14/2005    Lab Results   Component Value Date   TESTOSTERONE 100 (L) 04/01/2019    Lab Results  Component Value Date   HGBA1C 8.1 06/22/2008    Urinalysis No results found for: COLORURINE, APPEARANCEUR, LABSPEC, PHURINE, GLUCOSEU, HGBUR, BILIRUBINUR, KETONESUR, PROTEINUR, UROBILINOGEN, NITRITE, LEUKOCYTESUR  No results found for: LABMICR, [place], RBCUA, LABEPIT, MUCUS, BACTERIA  Pertinent Imaging:  No results found for this or any previous visit.  No results found for this or any previous visit.  No results found for this or any previous visit.  No results found for this or any previous visit.  No results found for this or any previous visit.  No results found for this or any previous visit.  No results found for this or any previous visit.  No results found for this or any previous visit.   Assessment & Plan:    1. Benign prostatic hyperplasia with urinary obstruction -Continue flomax 0.191m daily - Urinalysis, Routine w reflex microscopic  2. Erectile dysfunction due to arterial insufficiency -We will refill trimix  3. Male hypogonadism -Continue IM testosterone 2023mevery 2 weeks  4. Nocturia -continue flomax 0.41m42maily  5. Premature ejaculation -Paxil 91m61mily  RTC 6 weeks    No follow-ups on file.  [name]  [facility] [place]

## 2021-02-16 ENCOUNTER — Telehealth: Payer: Self-pay

## 2021-02-16 NOTE — Telephone Encounter (Signed)
Patient advised [facility] needed a new rx sent for patient to be able to pick up.   Medication: Testosterone Cypionate 200 MG/ML KIT  Pharmacy:  [facility]

## 2021-02-16 NOTE — Telephone Encounter (Signed)
Called patient to verify which rx needs to be sent to [place] [facility]-  Patient has multiple medications that need clarification.   Left message to return call to office.

## 2021-02-28 ENCOUNTER — Other Ambulatory Visit: Payer: Self-pay

## 2021-02-28 DIAGNOSIS — E291 Testicular hypofunction: Secondary | ICD-10-CM

## 2021-02-28 MED ORDER — AMBULATORY NON FORMULARY MEDICATION: 0.2000 mL | 5 refills | Status: DC | PRN

## 2021-02-28 MED ORDER — AMBULATORY NON FORMULARY MEDICATION
0.2000 mL | 5 refills | Status: DC | PRN
Start: 2021-02-28 — End: 2021-08-11

## 2021-02-28 NOTE — Telephone Encounter (Signed)
Trimix rx printed and faxed to [facility].

## 2021-04-05 ENCOUNTER — Ambulatory Visit: Payer: Medicare Other | Admitting: Urology

## 2021-05-02 ENCOUNTER — Encounter: Payer: Self-pay | Admitting: *Deleted

## 2021-05-03 ENCOUNTER — Ambulatory Visit: Payer: Medicare Other | Admitting: Urology

## 2021-05-19 ENCOUNTER — Ambulatory Visit (INDEPENDENT_AMBULATORY_CARE_PROVIDER_SITE_OTHER): Payer: Medicare Other | Admitting: Urology

## 2021-05-19 ENCOUNTER — Encounter: Payer: Self-pay | Admitting: Urology

## 2021-05-19 VITALS — BP 156/83 | HR 57

## 2021-05-19 DIAGNOSIS — N401 Enlarged prostate with lower urinary tract symptoms: Secondary | ICD-10-CM | POA: Diagnosis not present

## 2021-05-19 DIAGNOSIS — N5201 Erectile dysfunction due to arterial insufficiency: Secondary | ICD-10-CM | POA: Diagnosis not present

## 2021-05-19 DIAGNOSIS — N138 Other obstructive and reflux uropathy: Secondary | ICD-10-CM

## 2021-05-19 DIAGNOSIS — F524 Premature ejaculation: Secondary | ICD-10-CM

## 2021-05-19 DIAGNOSIS — E291 Testicular hypofunction: Secondary | ICD-10-CM | POA: Diagnosis not present

## 2021-05-19 DIAGNOSIS — R351 Nocturia: Secondary | ICD-10-CM

## 2021-05-19 LAB — URINALYSIS, ROUTINE W REFLEX MICROSCOPIC
Bilirubin, UA: NEGATIVE
Glucose, UA: NEGATIVE
Ketones, UA: NEGATIVE
Leukocytes,UA: NEGATIVE
Nitrite, UA: NEGATIVE
Protein,UA: NEGATIVE
RBC, UA: NEGATIVE
Specific Gravity, UA: >1.03 — ABNORMAL HIGH (ref 1.005–1.030)
Urobilinogen, Ur: 0.2 mg/dL (ref 0.2–1.0)
pH, UA: 5.5 (ref 5.0–7.5)

## 2021-05-19 MED ORDER — TAMSULOSIN HCL 0.4 MG PO CAPS: 0.4000 mg | ORAL_CAPSULE | Freq: Every day | ORAL | 3 refills | Status: DC

## 2021-05-19 MED ORDER — PAROXETINE HCL 10 MG PO TABS: 10.0000 mg | ORAL_TABLET | Freq: Every day | ORAL | 5 refills | Status: AC

## 2021-05-19 NOTE — Progress Notes (Signed)
? ?05/19/2021 ?10:49 AM  ? ?Dillon Obrien ?[birth year] ?[id number] ? ?Referring provider: Jani Gravel, MD ?[address] ?[address] ?[place],  [place] [address] ? ?Followup erectile dysfunction  ? ? ?HPI: ?Dillon Obrien is a 71yo here for followup erectile dysfunction and premature ejaculation. He gets a semifirm erection erection with 0.26m trimix. Paxil worked well for his premature ejaculation. Good libido. NO other complaints today. He has stable LUTS on flomax 0.431mdaily ? ? ?PMH: ?Past Medical History:  ?Diagnosis Date  ? Diabetes mellitus   ? Hypertension   ? ? ?Surgical History: ?Past Surgical History:  ?Procedure Laterality Date  ? COLONOSCOPY  05/21/2011  ? Procedure: COLONOSCOPY;  Surgeon: Dillon Obrien;  [facility];  Service: Endoscopy;  Laterality: N/A;  7:30 AM  ? ? ?Home Medications:  ?Allergies as of 05/19/2021   ?No Known Allergies ?  ? ?  ?Medication List  ?  ? ?  ? Accurate as of May 19, 2021 10:49 AM. If you have any questions, ask your nurse or doctor.  ?  ?  ? ?  ? ?alfuzosin 10 MG 24 hr tablet ?Commonly known as: UROXATRAL ?Take 1 tablet (10 mg total) by mouth daily with breakfast. ?  ?AMBULATORY NON FORMULARY MEDICATION ?0.2 mLs by Intracavernosal route as needed. Medication Name: Trimix ? ?PGE 3079m?Pap 79m3mhent 1mg 47m?amLODipine 10 MG tablet ?Commonly known as: NORVASC ?Take 10 mg by mouth daily. ?  ?cimetidine 200 MG tablet ?Commonly known as: TAGAMET ?Take 200 mg by mouth daily as needed (FOR ACID REFLUX). ?  ?insulin glargine 100 UNIT/ML injection ?Commonly known as: LANTUS ?Inject 10 Units into the skin at bedtime. ?  ?lisinopril 40 MG tablet ?Commonly known as: ZESTRIL ?Take 40 mg by mouth daily. ?  ?metFORMIN 750 MG 24 hr tablet ?Commonly known as: GLUCOPHAGE-XR ?Take 750 mg by mouth 2 (two) times daily. ?  ?metFORMIN 500 MG tablet ?Commonly known as: GLUCOPHAGE ?Take 500 mg by mouth 2 (two) times daily. ?  ?metFORMIN 1000 MG tablet ?Commonly known as: GLUCOPHAGE ?Take  1,000 mg by mouth 2 (two) times daily. ?  ?Ozempic (1 MG/DOSE) 4 MG/3ML Sopn ?Generic drug: Semaglutide (1 MG/DOSE) ?Inject 1 mg into the skin once a week. ?  ?PARoxetine 10 MG tablet ?Commonly known as: Paxil ?Take 1 tablet (10 mg total) by mouth daily. ?  ?tamsulosin 0.4 MG Caps capsule ?Commonly known as: FLOMAX ?Take 0.4 mg by mouth daily. ?  ?Testosterone 20.25 MG/1.25GM (1.62%) Gel ?Commonly known as: AndroGel ?Apply 1 Pump topically daily. ?  ?Testosterone 20.25 MG/ACT (1.62%) Gel ?SMARTSIG:1 pump Topical Daily ?  ?Testosterone Cypionate 200 MG/ML Kit ?Inject 200 mg into the muscle every 14 (fourteen) days. ?  ?vardenafil 20 MG tablet ?Commonly known as: LEVITRA ?Take 1 tablet (20 mg total) by mouth daily as needed for erectile dysfunction. ?  ? ?  ? ? ?Allergies: No Known Allergies ? ?Family History: ?No family history on file. ? ?Social History:  reports that he has never smoked. He has never used smokeless tobacco. He reports that he does not drink alcohol and does not use drugs. ? ?ROS: ?All other review of systems were reviewed and are negative except what is noted above in HPI ? ?Physical Exam: ?BP (!) 156/83   Pulse (!) 57   ?Constitutional:  Alert and oriented, No acute distress. ?HEENT: Dillon Obrien AT, moist mucus membranes.  Trachea midline, no masses. ?Cardiovascular: No clubbing, cyanosis, or edema. ?  Respiratory: Normal respiratory effort, no increased work of breathing. ?GI: Abdomen is soft, nontender, nondistended, no abdominal masses ?GU: No CVA tenderness.  ?Lymph: No cervical or inguinal lymphadenopathy. ?Skin: No rashes, bruises or suspicious lesions. ?Neurologic: Grossly intact, no focal deficits, moving all 4 extremities. ?Psychiatric: Normal mood and affect. ? ?Laboratory Data: ?Lab Results  ?Component Value Date  ? WBC 5.1 04/01/2019  ? HGB 13.1 (L) 04/01/2019  ? HCT 38.7 04/01/2019  ? MCV 87.4 04/01/2019  ? PLT 216 04/01/2019  ? ? ?Lab Results  ?Component Value Date  ? CREATININE 1.05  04/01/2019  ? ? ?Lab Results  ?Component Value Date  ? PSA 2.64 12/26/2007  ? PSA 1.48 09/24/2007  ? PSA 5.92 11/14/2005  ? ? ?Lab Results  ?Component Value Date  ? TESTOSTERONE 100 (L) 04/01/2019  ? ? ?Lab Results  ?Component Value Date  ? HGBA1C 8.1 06/22/2008  ? ? ?Urinalysis ?   ?Component Value Date/Time  ? APPEARANCEUR Clear 02/15/2021 1545  ? GLUCOSEU Negative 02/15/2021 1545  ? BILIRUBINUR Negative 02/15/2021 1545  ? PROTEINUR Negative 02/15/2021 1545  ? NITRITE Negative 02/15/2021 1545  ? LEUKOCYTESUR Negative 02/15/2021 1545  ? ? ?Lab Results  ?Component Value Date  ? LABMICR See below: 02/15/2021  ? [place] None seen 02/15/2021  ? LABEPIT 0-10 02/15/2021  ? MUCUS Present 02/15/2021  ? BACTERIA None seen 02/15/2021  ? ? ?Pertinent Imaging: ? ?No results found for this or any previous visit. ? ?No results found for this or any previous visit. ? ?No results found for this or any previous visit. ? ?No results found for this or any previous visit. ? ?No results found for this or any previous visit. ? ?No results found for this or any previous visit. ? ?No results found for this or any previous visit. ? ?No results found for this or any previous visit. ? ? ?Assessment & Plan:   ? ?1. Erectile dysfunction ?-increase trimix to 0.70m ? 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 ?3. Nocturia ?-continue flomax 0.35m22maily ? ?4. Premature ejaculation ?-continue paxil 57m71mily ? ? ?No follow-ups on file. ? ?PatrNicolette Obrien ? ?[facility] Obrien?

## 2021-05-19 NOTE — Patient Instructions (Signed)
Erectile Dysfunction ?Erectile dysfunction (ED) is the inability to get or keep an erection in order to have sexual intercourse. ED is considered a symptom of an underlying disorder and is not considered a disease. ED may include: ?Inability to get an erection. ?Lack of enough hardness of the erection to allow penetration. ?Loss of erection before sex is finished. ?What are the causes? ?This condition may be caused by: ?Physical causes, such as: ?Artery problems. This may include heart disease, high blood pressure, atherosclerosis, and diabetes. ?Hormonal problems, such as low testosterone. ?Obesity. ?Nerve problems. This may include back or pelvic injuries, multiple sclerosis, Parkinson's disease, spinal cord injury, and stroke. ?Certain medicines, such as: ?Pain relievers. ?Antidepressants. ?Blood pressure medicines and water pills (diuretics). ?Cancer medicines. ?Antihistamines. ?Muscle relaxants. ?Lifestyle factors, such as: ?Use of drugs such as marijuana, cocaine, or opioids. ?Excessive use of alcohol. ?Smoking. ?Lack of physical activity or exercise. ?Psychological causes, such as: ?Anxiety or stress. ?Sadness or depression. ?Exhaustion. ?Fear about sexual performance. ?Guilt. ?What are the signs or symptoms? ?Symptoms of this condition include: ?Inability to get an erection. ?Lack of enough hardness of the erection to allow penetration. ?Loss of the erection before sex is finished. ?Sometimes having normal erections, but with frequent unsatisfactory episodes. ?Low sexual satisfaction in either partner due to erection problems. ?A curved penis occurring with erection. The curve may cause pain, or the penis may be too curved to allow for intercourse. ?Never having nighttime or morning erections. ?How is this diagnosed? ?This condition is often diagnosed by: ?Performing a physical exam to find other diseases or specific problems with the penis. ?Asking you detailed questions about the problem. ?Doing tests,  such as: ?Blood tests to check for diabetes mellitus or high cholesterol, or to measure hormone levels. ?Other tests to check for underlying health conditions. ?An ultrasound exam to check for scarring. ?A test to check blood flow to the penis. ?Doing a sleep study at home to measure nighttime erections. ?How is this treated? ?This condition may be treated by: ?Medicines, such as: ?Medicine taken by mouth to help you achieve an erection (oral medicine). ?Hormone replacement therapy to replace low testosterone levels. ?Medicine that is injected into the penis. Your health care provider may instruct you how to give yourself these injections at home. ?Medicine that is delivered with a short applicator tube. The tube is inserted into the opening at the tip of the penis, which is the opening of the urethra. A tiny pellet of medicine is put in the urethra. The pellet dissolves and enhances erectile function. This is also called MUSE (medicated urethral system for erections) therapy. ?Vacuum pump. This is a pump with a ring on it. The pump and ring are placed on the penis and used to create pressure that helps the penis become erect. ?Penile implant surgery. In this procedure, you may receive: ?An inflatable implant. This consists of cylinders, a pump, and a reservoir. The cylinders can be inflated with a fluid that helps to create an erection, and they can be deflated after intercourse. ?A semi-rigid implant. This consists of two silicone rubber rods. The rods provide some rigidity. They are also flexible, so the penis can both curve downward in its normal position and become straight for sexual intercourse. ?Blood vessel surgery to improve blood flow to the penis. During this procedure, a blood vessel from a different part of the body is placed into the penis to allow blood to flow around (bypass) damaged or blocked blood vessels. ?Lifestyle changes,   such as exercising more, losing weight, and quitting smoking. ?Follow  these instructions at home: ?Medicines ? ?Take over-the-counter and prescription medicines only as told by your health care provider. Do not increase the dosage without first discussing it with your health care provider. ?If you are using self-injections, do injections as directed by your health care provider. Make sure you avoid any veins that are on the surface of the penis. After giving an injection, apply pressure to the injection site for 5 minutes. ?Talk to your health care provider about how to prevent headaches while taking ED medicines. These medicines may cause a sudden headache due to the increase in blood flow in your body. ?General instructions ?Exercise regularly, as directed by your health care provider. Work with your health care provider to lose weight, if needed. ?Do not use any products that contain nicotine or tobacco. These products include cigarettes, chewing tobacco, and vaping devices, such as e-cigarettes. If you need help quitting, ask your health care provider. ?Before using a vacuum pump, read the instructions that come with the pump and discuss any questions with your health care provider. ?Keep all follow-up visits. This is important. ?Contact a health care provider if: ?You feel nauseous. ?You are vomiting. ?You get sudden headaches while taking ED medicines. ?You have any concerns about your sexual health. ?Get help right away if: ?You are taking oral or injectable medicines and you have an erection that lasts longer than 4 hours. If your health care provider is unavailable, go to the nearest emergency room for evaluation. An erection that lasts much longer than 4 hours can result in permanent damage to your penis. ?You have severe pain in your groin or abdomen. ?You develop redness or severe swelling of your penis. ?You have redness spreading at your groin or lower abdomen. ?You are unable to urinate. ?You experience chest pain or a rapid heartbeat (palpitations) after taking oral  medicines. ?These symptoms may represent a serious problem that is an emergency. Do not wait to see if the symptoms will go away. Get medical help right away. Call your local emergency services (911 in the [place] Do not drive yourself to the hospital. ?Summary ?Erectile dysfunction (ED) is the inability to get or keep an erection during sexual intercourse. ?This condition is diagnosed based on a physical exam, your symptoms, and tests to determine the cause. Treatment varies depending on the cause and may include medicines, hormone therapy, surgery, or a vacuum pump. ?You may need follow-up visits to make sure that you are using your medicines or devices correctly. ?Get help right away if you are taking or injecting medicines and you have an erection that lasts longer than 4 hours. ?This information is not intended to replace advice given to you by your health care provider. Make sure you discuss any questions you have with your health care provider. ?Document Revised: 03/30/2020 Document Reviewed: 03/30/2020 ?Elsevier Patient Education ? 2023 [employer] ? ?

## 2021-07-27 IMAGING — DX DG HIP (WITH OR WITHOUT PELVIS) 2-3V*R*
3 series · 3 of 3 positions shown · non-contrast
Comparison: None.

CLINICAL DATA: Lower back and right hip pain.

EXAM:
DG HIP (WITH OR WITHOUT PELVIS) 2-3V RIGHT

[pelvis ap]
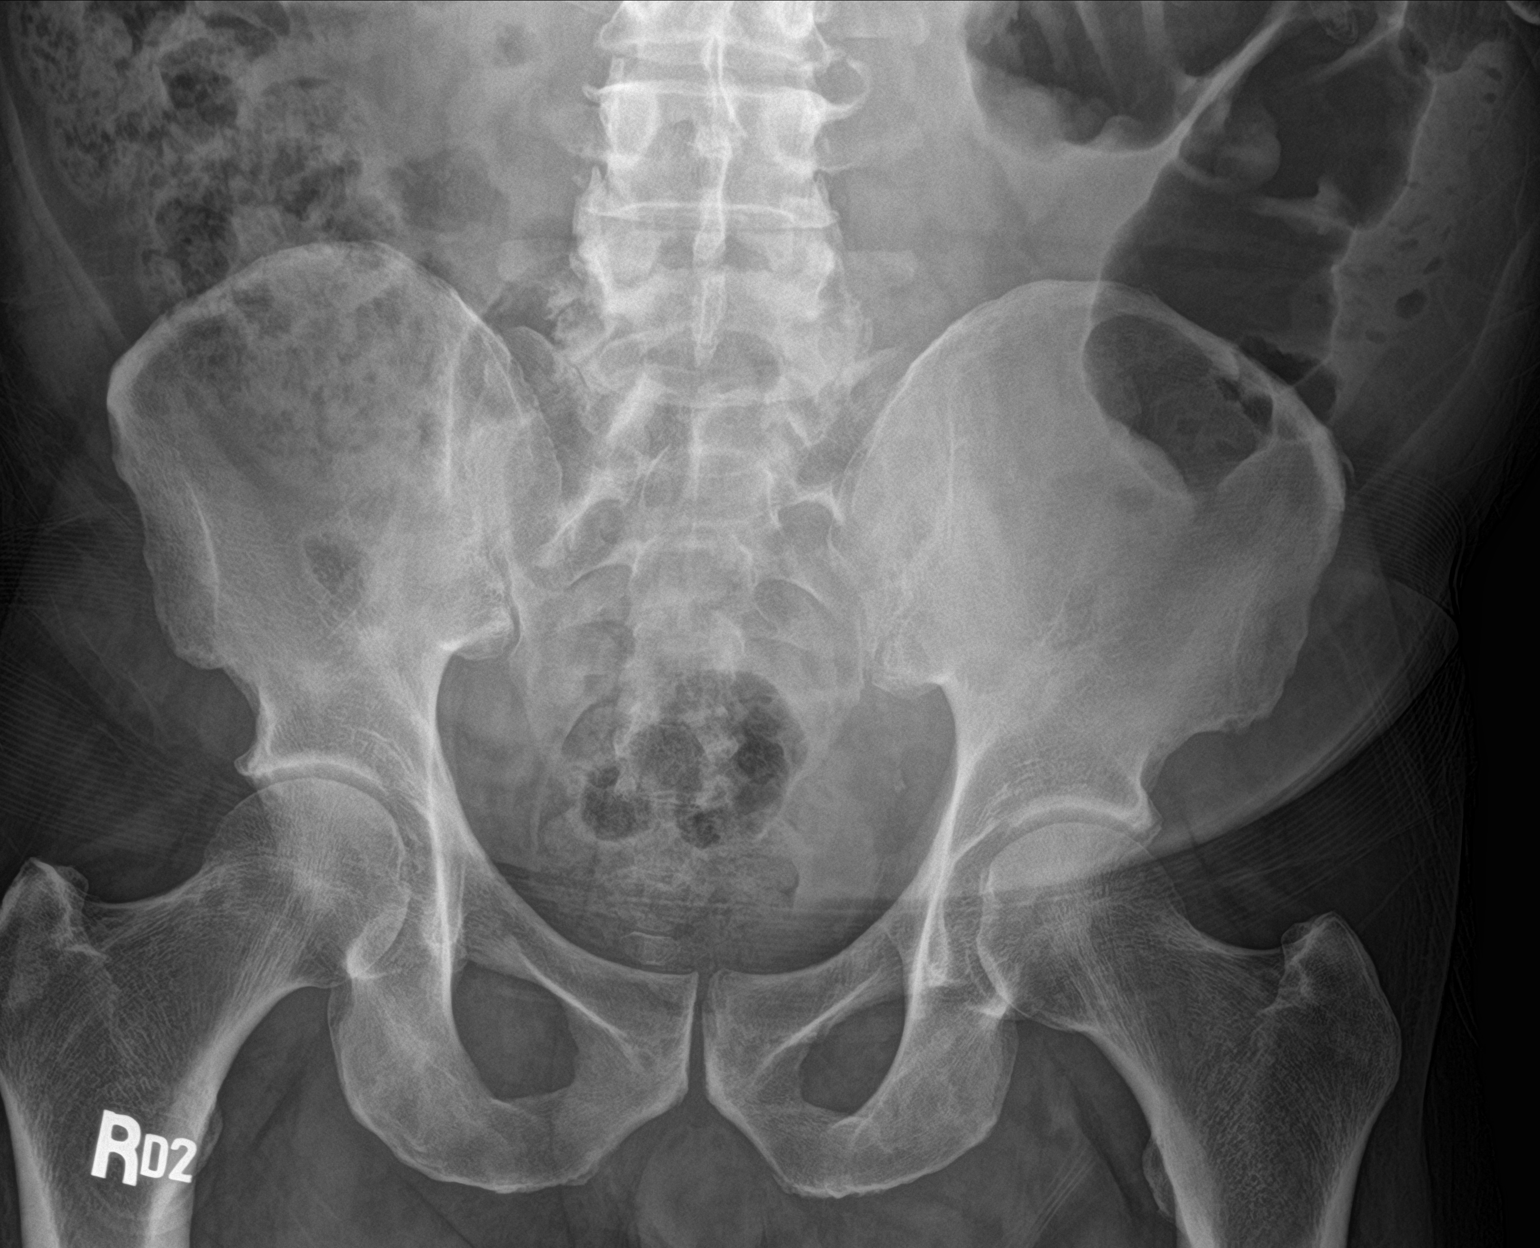

[hip ap]
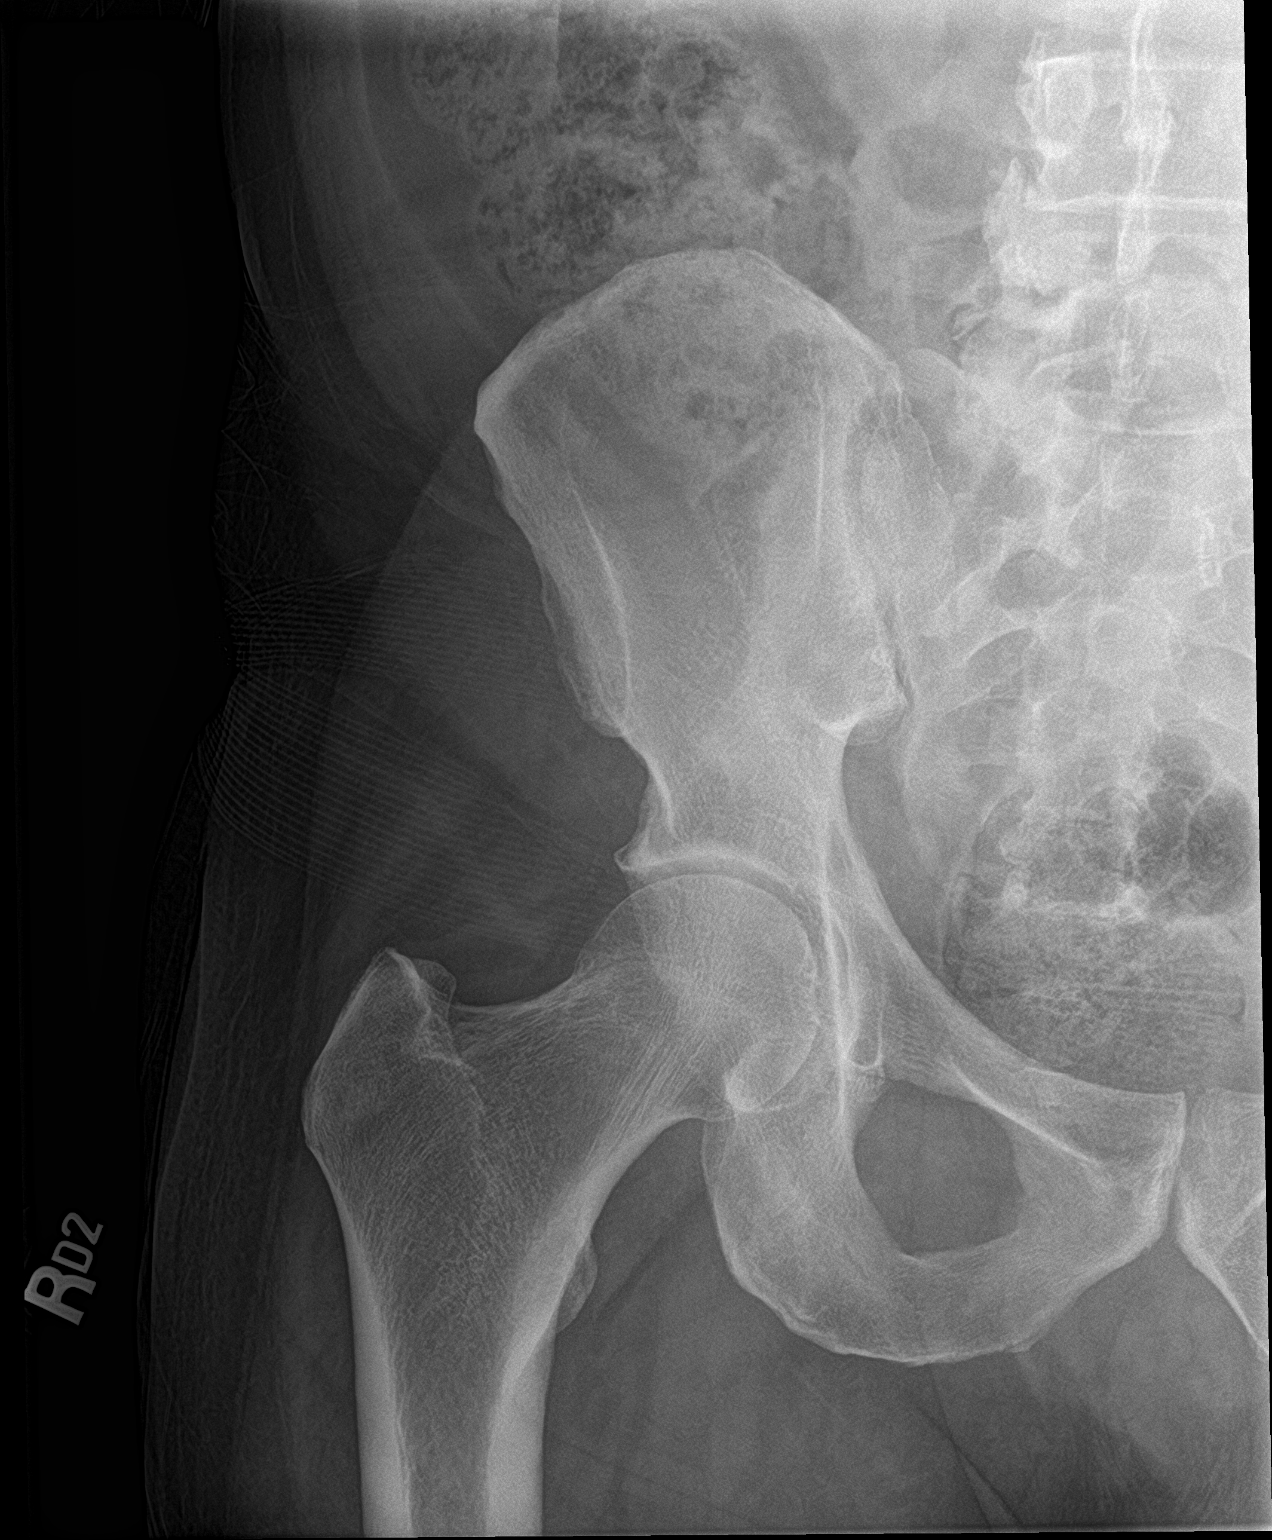

[hip frog leg]
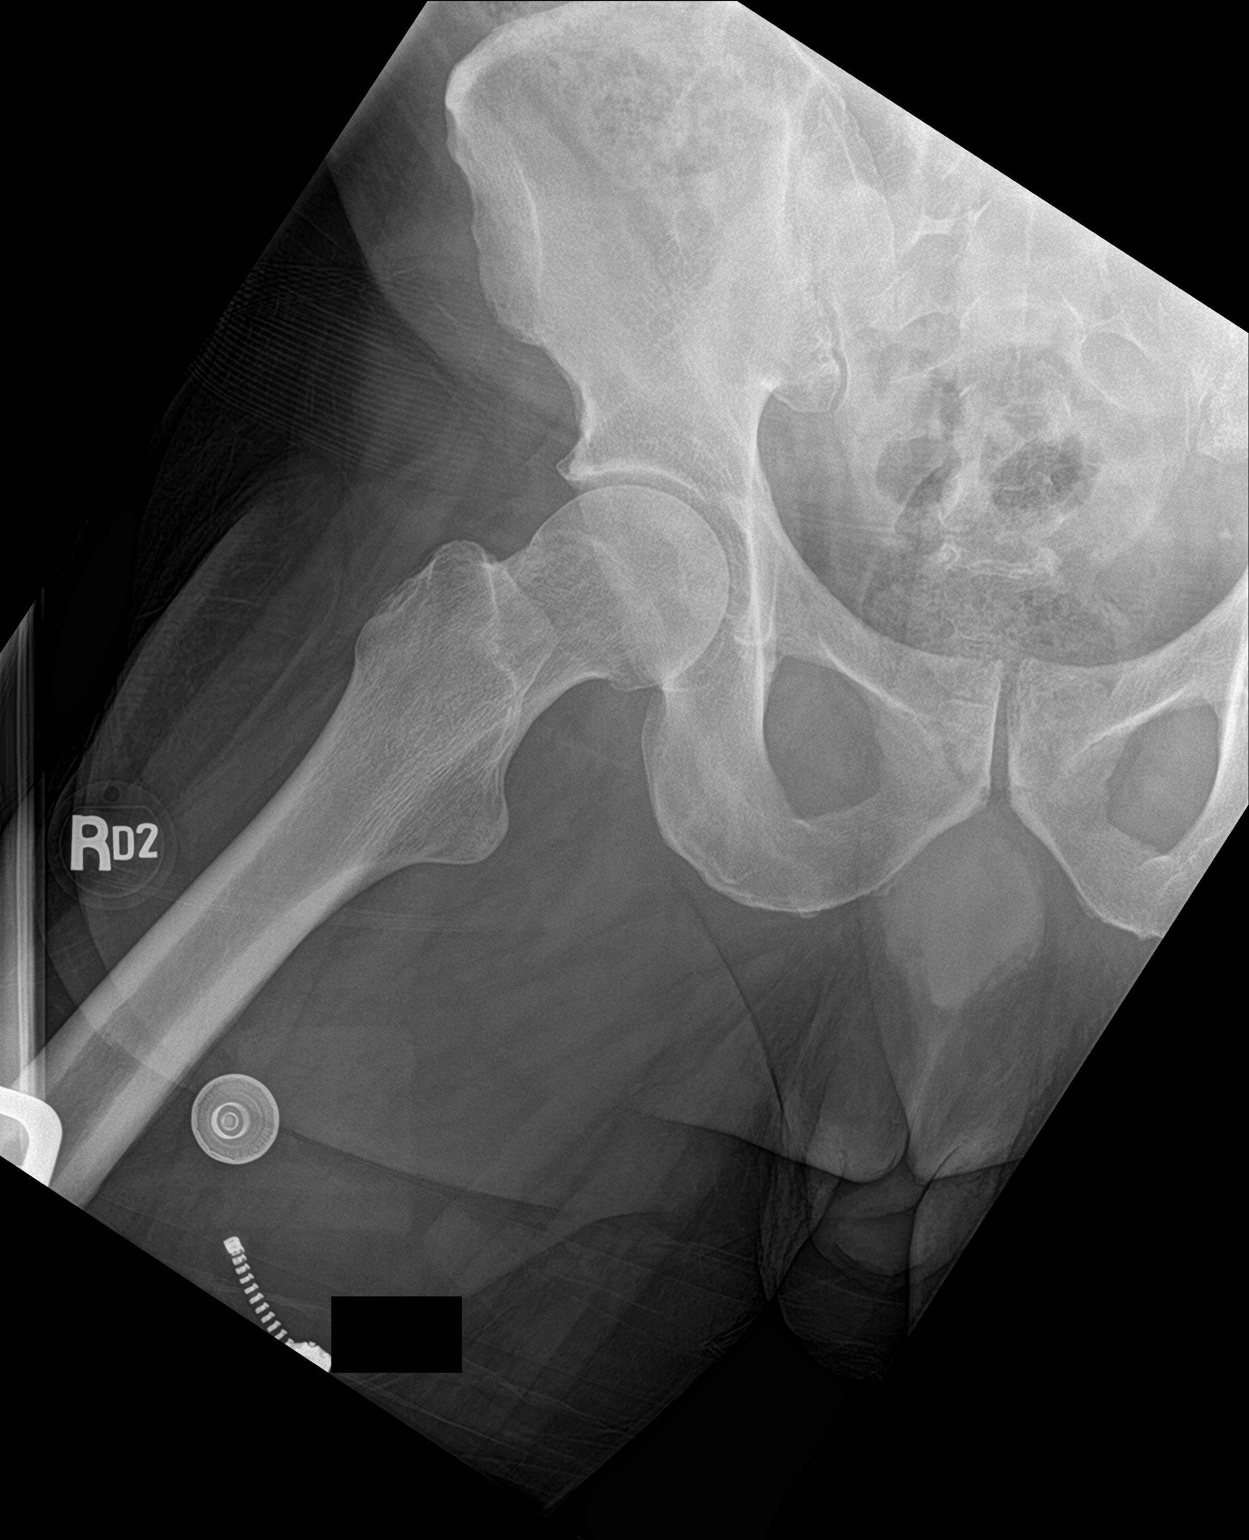

[3 of 3 positions shown; findings below may reference images not displayed]

FINDINGS: There is no evidence of hip fracture or dislocation. There is no
evidence of arthropathy or other focal bone abnormality.
IMPRESSION: Negative.

## 2021-08-10 ENCOUNTER — Telehealth: Payer: Self-pay

## 2021-08-10 DIAGNOSIS — E291 Testicular hypofunction: Secondary | ICD-10-CM

## 2021-08-10 NOTE — Telephone Encounter (Signed)
Patient needing a call back regarding medication that was once prescribed as an injection to help with an erection.  Please verify.  Call back:  [phone] - call after 3:30pm  Thanks, Helene Kelp

## 2021-08-11 MED ORDER — AMBULATORY NON FORMULARY MEDICATION: 0.2000 mL | 5 refills | Status: DC | PRN

## 2021-08-11 NOTE — Telephone Encounter (Signed)
Patient requested refill on trimix. Rx printed and will fax Monday to [facility] in [place]  Patient made aware and voiced understanding.

## 2021-08-14 NOTE — Telephone Encounter (Signed)
I was not here this day. Do I need to resend?

## 2021-08-15 ENCOUNTER — Other Ambulatory Visit: Payer: Self-pay

## 2021-08-15 DIAGNOSIS — E291 Testicular hypofunction: Secondary | ICD-10-CM

## 2021-08-15 MED ORDER — AMBULATORY NON FORMULARY MEDICATION: 0.2000 mL | 5 refills | Status: DC | PRN

## 2021-08-15 NOTE — Telephone Encounter (Signed)
Prescription printed and faxed to [facility]

## 2021-11-02 ENCOUNTER — Encounter: Payer: Self-pay | Admitting: *Deleted

## 2021-11-07 ENCOUNTER — Other Ambulatory Visit: Payer: Medicare Other

## 2021-11-17 ENCOUNTER — Ambulatory Visit: Payer: Medicare Other | Admitting: Urology

## 2021-11-21 ENCOUNTER — Other Ambulatory Visit: Payer: Self-pay

## 2021-11-21 ENCOUNTER — Other Ambulatory Visit: Payer: Medicare Other

## 2021-11-21 DIAGNOSIS — E291 Testicular hypofunction: Secondary | ICD-10-CM

## 2021-11-21 NOTE — Addendum Note (Signed)
Addended by: Darcella Gasman R on: 11/21/2021 08:47 AM   Modules accepted: Orders

## 2021-11-21 NOTE — Addendum Note (Signed)
Addended by: Darcella Gasman R on: 11/21/2021 10:56 AM   Modules accepted: Orders

## 2021-11-23 LAB — COMPREHENSIVE METABOLIC PANEL
ALT: 14 IU/L (ref 0–44)
AST: 18 IU/L (ref 0–40)
Albumin/Globulin Ratio: 1.3 (ref 1.2–2.2)
Albumin: 4.4 g/dL (ref 3.9–4.9)
Alkaline Phosphatase: 90 IU/L (ref 44–121)
BUN/Creatinine Ratio: 16 (ref 10–24)
BUN: 18 mg/dL (ref 8–27)
Bilirubin Total: 0.2 mg/dL (ref 0.0–1.2)
CO2: 24 mmol/L (ref 20–29)
Calcium: 9.5 mg/dL (ref 8.6–10.2)
Chloride: 105 mmol/L (ref 96–106)
Creatinine, Ser: 1.1 mg/dL (ref 0.76–1.27)
Globulin, Total: 3.4 g/dL (ref 1.5–4.5)
Glucose: 114 mg/dL — ABNORMAL HIGH (ref 70–99)
Potassium: 5.1 mmol/L (ref 3.5–5.2)
Sodium: 143 mmol/L (ref 134–144)
Total Protein: 7.8 g/dL (ref 6.0–8.5)
eGFR: 73 mL/min/{1.73_m2} (ref >59)

## 2021-11-23 LAB — CBC
Hematocrit: 39.9 % (ref 37.5–51.0)
Hemoglobin: 13.4 g/dL (ref 13.0–17.7)
MCH: 29.5 pg (ref 26.6–33.0)
MCHC: 33.6 g/dL (ref 31.5–35.7)
MCV: 88 fL (ref 79–97)
Platelets: 223 10*3/uL (ref 150–450)
RBC: 4.54 x10E6/uL (ref 4.14–5.80)
RDW: 13.3 % (ref 11.6–15.4)
WBC: 4.5 10*3/uL (ref 3.4–10.8)

## 2021-11-23 LAB — TESTOSTERONE,FREE AND TOTAL
Testosterone, Free: 10.5 pg/mL (ref 6.6–18.1)
Testosterone: 161 ng/dL — ABNORMAL LOW (ref 264–916)

## 2021-11-23 LAB — ESTRADIOL: Estradiol: 21.6 pg/mL (ref 7.6–42.6)

## 2021-11-27 ENCOUNTER — Ambulatory Visit: Payer: Medicare Other | Admitting: Urology

## 2021-12-11 ENCOUNTER — Other Ambulatory Visit: Payer: Self-pay

## 2021-12-11 ENCOUNTER — Telehealth: Payer: Self-pay

## 2021-12-11 DIAGNOSIS — E291 Testicular hypofunction: Secondary | ICD-10-CM

## 2021-12-11 MED ORDER — AMBULATORY NON FORMULARY MEDICATION: 0.2000 mL | 5 refills | Status: DC | PRN

## 2021-12-11 NOTE — Telephone Encounter (Signed)
Medication refilled and faxed to [facility] left on patient phone medication

## 2021-12-11 NOTE — Telephone Encounter (Signed)
Patient calling to get injection medication called into pharmacy.  Please call pt back once called in. Call back [phone]    Thanks, Helene Kelp

## 2021-12-15 ENCOUNTER — Encounter: Payer: Self-pay | Admitting: Urology

## 2021-12-15 ENCOUNTER — Ambulatory Visit (INDEPENDENT_AMBULATORY_CARE_PROVIDER_SITE_OTHER): Payer: Medicare Other | Admitting: Urology

## 2021-12-15 ENCOUNTER — Other Ambulatory Visit: Payer: Self-pay | Admitting: Urology

## 2021-12-15 VITALS — BP 173/75 | HR 61

## 2021-12-15 DIAGNOSIS — E291 Testicular hypofunction: Secondary | ICD-10-CM | POA: Diagnosis not present

## 2021-12-15 DIAGNOSIS — N138 Other obstructive and reflux uropathy: Secondary | ICD-10-CM | POA: Diagnosis not present

## 2021-12-15 DIAGNOSIS — N401 Enlarged prostate with lower urinary tract symptoms: Secondary | ICD-10-CM

## 2021-12-15 DIAGNOSIS — N5201 Erectile dysfunction due to arterial insufficiency: Secondary | ICD-10-CM | POA: Diagnosis not present

## 2021-12-15 LAB — URINALYSIS, ROUTINE W REFLEX MICROSCOPIC
Bilirubin, UA: NEGATIVE
Glucose, UA: NEGATIVE
Ketones, UA: NEGATIVE
Leukocytes,UA: NEGATIVE
Nitrite, UA: NEGATIVE
Protein,UA: NEGATIVE
RBC, UA: NEGATIVE
Specific Gravity, UA: 1.01 (ref 1.005–1.030)
Urobilinogen, Ur: 0.2 mg/dL (ref 0.2–1.0)
pH, UA: 6.5 (ref 5.0–7.5)

## 2021-12-15 MED ORDER — TESTOSTERONE 20.25 MG/1.25GM (1.62%) TD GEL: 2.0000 | Freq: Every day | TRANSDERMAL | 2 refills | Status: DC

## 2021-12-15 NOTE — Progress Notes (Signed)
12/15/2021 12:49 PM   Dillon Obrien [birth year] [id number]  Referring provider: Yves Dill, NP [place],  [place] [address]  Followup BPH and Erectile dysfunction   HPI: Dillon Obrien is a 71yo here for followup for BPH and erectile dysfunction. IPSS 8 QOL 2 on flomax 0.62m daily. Urine stream strong. Nocturia 1-3x depending on fluid consumption. Patient has been out of his androgel and testosterone is low on todays labs. He has low energy, decreased libido. He uses vardenafil prn for his erectile dysfunction   PMH: Past Medical History:  Diagnosis Date   Diabetes mellitus    Hypertension     Surgical History: Past Surgical History:  Procedure Laterality Date   COLONOSCOPY  05/21/2011   Procedure: COLONOSCOPY;  Surgeon: RDaneil Dolin MD;  [facility];  Service: Endoscopy;  Laterality: N/A;  7:30 AM    Home Medications:  Allergies as of 12/15/2021   No Known Allergies      Medication List        Accurate as of December 15, 2021 12:49 PM. If you have any questions, ask your nurse or doctor.          alfuzosin 10 MG 24 hr tablet Commonly known as: UROXATRAL Take 1 tablet (10 mg total) by mouth daily with breakfast.   AMBULATORY NON FORMULARY MEDICATION 0.2 mLs by Intracavernosal route as needed. Medication Name: Trimix  PGE 366m Pap 3043mhent 1mg31mamLODipine 10 MG tablet Commonly known as: NORVASC Take 10 mg by mouth daily.   cimetidine 200 MG tablet Commonly known as: TAGAMET Take 200 mg by mouth daily as needed (FOR ACID REFLUX).   insulin glargine 100 UNIT/ML injection Commonly known as: LANTUS Inject 10 Units into the skin at bedtime.   lisinopril 40 MG tablet Commonly known as: ZESTRIL Take 40 mg by mouth daily.   metFORMIN 750 MG 24 hr tablet Commonly known as: GLUCOPHAGE-XR Take 750 mg by mouth 2 (two) times daily.   metFORMIN 500 MG tablet Commonly known as: GLUCOPHAGE Take 500 mg  by mouth 2 (two) times daily.   metFORMIN 1000 MG tablet Commonly known as: GLUCOPHAGE Take 1,000 mg by mouth 2 (two) times daily.   Ozempic (1 MG/DOSE) 4 MG/3ML Sopn Generic drug: Semaglutide (1 MG/DOSE) Inject 1 mg into the skin once a week.   PARoxetine 10 MG tablet Commonly known as: Paxil Take 1 tablet (10 mg total) by mouth daily.   tamsulosin 0.4 MG Caps capsule Commonly known as: FLOMAX Take 1 capsule (0.4 mg total) by mouth daily after supper.   Testosterone 20.25 MG/1.25GM (1.62%) Gel Commonly known as: AndroGel Apply 1 Pump topically daily.   Testosterone 20.25 MG/ACT (1.62%) Gel SMARTSIG:1 pump Topical Daily   Testosterone Cypionate 200 MG/ML Kit Inject 200 mg into the muscle every 14 (fourteen) days.   vardenafil 20 MG tablet Commonly known as: LEVITRA Take 1 tablet (20 mg total) by mouth daily as needed for erectile dysfunction.        Allergies: No Known Allergies  Family History: No family history on file.  Social History:  reports that he has never smoked. He has never used smokeless tobacco. He reports that he does not drink alcohol and does not use drugs.  ROS: All other review of systems were reviewed and are negative except what is noted above in HPI  Physical Exam: BP (!) 173/75   Pulse 61   Constitutional:  Alert and  oriented, No acute distress. HEENT: [place] AT, moist mucus membranes.  Trachea midline, no masses. Cardiovascular: No clubbing, cyanosis, or edema. Respiratory: Normal respiratory effort, no increased work of breathing. GI: Abdomen is soft, nontender, nondistended, no abdominal masses GU: No CVA tenderness.  Lymph: No cervical or inguinal lymphadenopathy. Skin: No rashes, bruises or suspicious lesions. Neurologic: Grossly intact, no focal deficits, moving all 4 extremities. Psychiatric: Normal mood and affect.  Laboratory Data: Lab Results  Component Value Date   WBC 4.5 11/21/2021   HGB 13.4 11/21/2021   HCT 39.9  11/21/2021   MCV 88 11/21/2021   PLT 223 11/21/2021    Lab Results  Component Value Date   CREATININE 1.10 11/21/2021    Lab Results  Component Value Date   PSA 2.64 12/26/2007   PSA 1.48 09/24/2007   PSA 5.92 11/14/2005    Lab Results  Component Value Date   TESTOSTERONE 161 (L) 11/21/2021    Lab Results  Component Value Date   HGBA1C 8.1 06/22/2008    Urinalysis    Component Value Date/Time   APPEARANCEUR Clear 05/19/2021 1214   GLUCOSEU Negative 05/19/2021 1214   BILIRUBINUR Negative 05/19/2021 1214   PROTEINUR Negative 05/19/2021 1214   NITRITE Negative 05/19/2021 1214   LEUKOCYTESUR Negative 05/19/2021 1214    Lab Results  Component Value Date   LABMICR Comment 05/19/2021   WBCUA None seen 02/15/2021   LABEPIT 0-10 02/15/2021   MUCUS Present 02/15/2021   BACTERIA None seen 02/15/2021    Pertinent Imaging:  No results found for this or any previous visit.  No results found for this or any previous visit.  No results found for this or any previous visit.  No results found for this or any previous visit.  No results found for this or any previous visit.  No valid procedures specified. No results found for this or any previous visit.  No results found for this or any previous visit.   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 Benign prostatic hyperplasia with urinary obstruction, nocturia Continue flomax 0.39m daily - Urinalysis, Routine w reflex microscopic  3. Erectile dysfunction due to arterial insufficiency Vardenafil 223mprn   No follow-ups on file.  PaNicolette BangMD  [facility] [place]

## 2021-12-15 NOTE — Patient Instructions (Signed)

## 2021-12-27 ENCOUNTER — Other Ambulatory Visit: Payer: Self-pay

## 2021-12-27 NOTE — Telephone Encounter (Signed)
Pharmacy states they cannot dispense the testosterone gel.  The rx needs to be resent with the the quantity updated from 40g to 75g.

## 2021-12-29 ENCOUNTER — Other Ambulatory Visit: Payer: Self-pay | Admitting: Urology

## 2021-12-29 MED ORDER — TESTOSTERONE 20.25 MG/1.25GM (1.62%) TD GEL: 2.0000 | Freq: Every day | TRANSDERMAL | 2 refills | Status: DC

## 2022-01-01 NOTE — Telephone Encounter (Signed)
MD resent updated rx

## 2022-01-26 ENCOUNTER — Telehealth: Payer: Self-pay

## 2022-01-26 NOTE — Telephone Encounter (Signed)
Pharmacy needs an updated testosterone rx.  For pump rx qty needs to be 75.qty 40 is for the packets.  Can you please update rx.

## 2022-03-14 ENCOUNTER — Other Ambulatory Visit: Payer: Medicare Other

## 2022-03-21 ENCOUNTER — Ambulatory Visit: Payer: Medicare Other | Admitting: Urology

## 2022-04-16 ENCOUNTER — Encounter: Payer: Self-pay | Admitting: *Deleted

## 2022-05-16 ENCOUNTER — Ambulatory Visit: Payer: Medicare Other | Admitting: Urology

## 2022-07-11 ENCOUNTER — Ambulatory Visit: Payer: Medicare Other | Admitting: Urology

## 2022-07-11 VITALS — BP 158/80 | HR 60

## 2022-07-11 DIAGNOSIS — E291 Testicular hypofunction: Secondary | ICD-10-CM

## 2022-07-11 DIAGNOSIS — N401 Enlarged prostate with lower urinary tract symptoms: Secondary | ICD-10-CM

## 2022-07-11 DIAGNOSIS — R351 Nocturia: Secondary | ICD-10-CM | POA: Diagnosis not present

## 2022-07-11 DIAGNOSIS — N5201 Erectile dysfunction due to arterial insufficiency: Secondary | ICD-10-CM | POA: Diagnosis not present

## 2022-07-11 DIAGNOSIS — N138 Other obstructive and reflux uropathy: Secondary | ICD-10-CM

## 2022-07-11 MED ORDER — AMBULATORY NON FORMULARY MEDICATION: 0.2000 mL | 5 refills | Status: DC | PRN

## 2022-07-11 MED ORDER — TAMSULOSIN HCL 0.4 MG PO CAPS: 0.4000 mg | ORAL_CAPSULE | Freq: Every day | ORAL | 3 refills | Status: AC

## 2022-07-11 MED ORDER — TESTOSTERONE 20.25 MG/1.25GM (1.62%) TD GEL: 2.0000 | Freq: Every day | TRANSDERMAL | 2 refills | Status: DC

## 2022-07-11 NOTE — Progress Notes (Signed)
07/11/2022 2:08 PM   Dillon Obrien 1950-10-31 [id number]  Referring provider: Kara Pacer, NP [address] [place],  [place] [address]  Followup BPH, hypogonadism and erectile dysfunction  HPI: Dillon Obrien is a 71yo here for followup for hypogonadism, BPH and erectile dysfunction. He uses trimix prn with good results. IPSS 5 QOl 2 on flomax 0.4mg  daily. He restarted androgel 2 pumps daily. No recent labs   PMH: Past Medical History:  Diagnosis Date   Diabetes mellitus    Hypertension     Surgical History: Past Surgical History:  Procedure Laterality Date   COLONOSCOPY  05/21/2011   Procedure: COLONOSCOPY;  Surgeon: Corbin Ade, MD;  [facility];  Service: Endoscopy;  Laterality: N/A;  7:30 AM    Home Medications:  Allergies as of 07/11/2022   No Known Allergies      Medication List        Accurate as of July 11, 2022  2:08 PM. If you have any questions, ask your nurse or doctor.          STOP taking these medications    alfuzosin 10 MG 24 hr tablet Commonly known as: UROXATRAL       TAKE these medications    AMBULATORY NON FORMULARY MEDICATION 0.2 mLs by Intracavernosal route as needed. Medication Name: Trimix  PGE Pap 30mg  Phent 1mg    amLODipine 10 MG tablet Commonly known as: NORVASC Take 10 mg by mouth daily.   cimetidine 200 MG tablet Commonly known as: TAGAMET Take 200 mg by mouth daily as needed (FOR ACID REFLUX).   insulin glargine 100 UNIT/ML injection Commonly known as: LANTUS Inject 10 Units into the skin at bedtime.   lisinopril 40 MG tablet Commonly known as: ZESTRIL Take 40 mg by mouth daily.   metFORMIN 750 MG 24 hr tablet Commonly known as: GLUCOPHAGE-XR Take 750 mg by mouth 2 (two) times daily.   metFORMIN 500 MG tablet Commonly known as: GLUCOPHAGE Take 500 mg by mouth 2 (two) times daily.   metFORMIN 1000 MG tablet Commonly known as: GLUCOPHAGE Take 1,000 mg by  mouth 2 (two) times daily.   Ozempic (1 MG/DOSE) 4 MG/3ML Sopn Generic drug: Semaglutide (1 MG/DOSE) Inject 1 mg into the skin once a week.   PARoxetine 10 MG tablet Commonly known as: Paxil Take 1 tablet (10 mg total) by mouth daily.   tamsulosin 0.4 MG Caps capsule Commonly known as: FLOMAX Take 1 capsule (0.4 mg total) by mouth daily after supper.   Testosterone 20.25 MG/ACT (1.62%) Gel SMARTSIG:1 pump Topical Daily   Testosterone 20.25 MG/1.25GM (1.62%) Gel Place 2 Pump onto the skin daily.   Testosterone Cypionate 200 MG/ML Kit Inject 200 mg into the muscle every 14 (fourteen) days.   vardenafil 20 MG tablet Commonly known as: LEVITRA Take 1 tablet (20 mg total) by mouth daily as needed for erectile dysfunction.        Allergies: No Known Allergies  Family History: No family history on file.  Social History:  reports that he has never smoked. He has never used smokeless tobacco. He reports that he does not drink alcohol and does not use drugs.  ROS: All other review of systems were reviewed and are negative except what is noted above in HPI  Physical Exam: BP (!) 158/80   Pulse 60   Constitutional:  Alert and oriented, No acute distress. HEENT: [place] AT, moist mucus membranes.  Trachea midline, no masses.  Cardiovascular: No clubbing, cyanosis, or edema. Respiratory: Normal respiratory effort, no increased work of breathing. GI: Abdomen is soft, nontender, nondistended, no abdominal masses GU: No CVA tenderness.  Lymph: No cervical or inguinal lymphadenopathy. Skin: No rashes, bruises or suspicious lesions. Neurologic: Grossly intact, no focal deficits, moving all 4 extremities. Psychiatric: Normal mood and affect.  Laboratory Data: Lab Results  Component Value Date   WBC 4.5 11/21/2021   HGB 13.4 11/21/2021   HCT 39.9 11/21/2021   MCV 88 11/21/2021   PLT 223 11/21/2021    Lab Results  Component Value Date   CREATININE 1.10 11/21/2021    Lab  Results  Component Value Date   PSA 2.64 12/26/2007   PSA 1.48 09/24/2007   PSA 5.92 11/14/2005    Lab Results  Component Value Date   TESTOSTERONE 161 (L) 11/21/2021    Lab Results  Component Value Date   HGBA1C 8.1 06/22/2008    Urinalysis    Component Value Date/Time   APPEARANCEUR Clear 12/15/2021 1209   GLUCOSEU Negative 12/15/2021 1209   BILIRUBINUR Negative 12/15/2021 1209   PROTEINUR Negative 12/15/2021 1209   NITRITE Negative 12/15/2021 1209   LEUKOCYTESUR Negative 12/15/2021 1209    Lab Results  Component Value Date   LABMICR Comment 12/15/2021   WBCUA None seen 02/15/2021   LABEPIT 0-10 02/15/2021   MUCUS Present 02/15/2021   BACTERIA None seen 02/15/2021    Pertinent Imaging:  No results found for this or any previous visit.  No results found for this or any previous visit.  No results found for this or any previous visit.  No results found for this or any previous visit.  No results found for this or any previous visit.  No valid procedures specified. No results found for this or any previous visit.  No results found for this or any previous visit.   Assessment & Plan:    1. Male hypogonadism Testosterone labs in 3 months -continue androgel 2 pumps daily  2. Benign prostatic hyperplasia with urinary obstruction Floma x0.4mg  daily  3. Erectile dysfunction due to arterial insufficiency Trimix prn  4. Nocturia Flomax 0.4mg  daily   No follow-ups on file.  Wilkie Aye, MD  [facility] [place]

## 2022-07-15 ENCOUNTER — Encounter: Payer: Self-pay | Admitting: Urology

## 2022-07-15 NOTE — Patient Instructions (Signed)

## 2022-07-16 ENCOUNTER — Encounter: Payer: Self-pay | Admitting: *Deleted

## 2022-10-05 ENCOUNTER — Other Ambulatory Visit: Payer: Medicare Other

## 2022-10-08 DIAGNOSIS — N529 Male erectile dysfunction, unspecified: Secondary | ICD-10-CM | POA: Insufficient documentation

## 2022-10-08 NOTE — Progress Notes (Deleted)
Name: Dillon Obrien DOB: [birth year] MRN: [id number]  History of Present Illness: Dillon Obrien is a 72 y.o. male who presents today for return visit at [facility] [place]. - GU history: 1. BPH with LUTS (nocturia). - ***Taking Flomax 0.4 mg daily.  2. Hypogonadism. - ***Uses Androgel 2 pumps daily. 3. Erectile dysfunction.  - Uses Trimix PRN for intracorporeal injections with good results.  At last visit with Dr. Ronne Binning on 07/11/2022: The plan was:  1. Male hypogonadism: Testosterone labs in 3 months. Continue androgel 2 pumps daily.  2. Benign prostatic hyperplasia with urinary obstruction: Continue Flomax 0.4mg  daily 3. Erectile dysfunction due to arterial insufficiency: Continue Trimix PRN.   Since last visit: ***  Today: He reports ***  He reports *** urinary stream. He {Actions; denies-reports:120008} urinary hesitancy, urgency, frequency, dysuria, gross hematuria, straining to void, or sensations of incomplete emptying.   Fall Screening: Do you usually have a device to assist in your mobility? {yes/no:20286} ***cane / ***walker / ***wheelchair   Medications: Current Outpatient Medications  Medication Sig Dispense Refill   AMBULATORY NON FORMULARY MEDICATION 0.2 mLs by Intracavernosal route as needed. Medication Name: Trimix  PGE Pap 30mg  Phent 1mg  5 mL 5   amLODipine (NORVASC) 10 MG tablet Take 10 mg by mouth daily.     cimetidine (TAGAMET) 200 MG tablet Take 200 mg by mouth daily as needed (FOR ACID REFLUX).     insulin glargine (LANTUS) 100 UNIT/ML injection Inject 10 Units into the skin at bedtime.      lisinopril (ZESTRIL) 40 MG tablet Take 40 mg by mouth daily.     metFORMIN (GLUCOPHAGE) 1000 MG tablet Take 1,000 mg by mouth 2 (two) times daily.     metFORMIN (GLUCOPHAGE) 500 MG tablet Take 500 mg by mouth 2 (two) times daily.     metFORMIN (GLUCOPHAGE-XR) 750 MG 24 hr tablet Take 750 mg by mouth 2 (two) times daily.     OZEMPIC, 1  MG/DOSE, 4 MG/3ML SOPN Inject 1 mg into the skin once a week.     PARoxetine (PAXIL) 10 MG tablet Take 1 tablet (10 mg total) by mouth daily. 30 tablet 5   tamsulosin (FLOMAX) 0.4 MG CAPS capsule Take 1 capsule (0.4 mg total) by mouth daily after supper. 90 capsule 3   Testosterone 20.25 MG/1.25GM (1.62%) GEL Place 2 Pump onto the skin daily. 75 g 2   Testosterone 20.25 MG/ACT (1.62%) GEL SMARTSIG:1 pump Topical Daily     Testosterone Cypionate 200 MG/ML KIT Inject 200 mg into the muscle every 14 (fourteen) days. 2 kit 3   vardenafil (LEVITRA) 20 MG tablet Take 1 tablet (20 mg total) by mouth daily as needed for erectile dysfunction. 10 tablet 3   No current facility-administered medications for this visit.    Allergies: No Known Allergies  Past Medical History:  Diagnosis Date   Diabetes mellitus    Hypertension    Past Surgical History:  Procedure Laterality Date   COLONOSCOPY  05/21/2011   Procedure: COLONOSCOPY;  Surgeon: Corbin Ade, MD;  [facility]: [facility];  Service: Endoscopy;  Laterality: N/A;  7:30 AM   No family history on file. Social History   Socioeconomic History   Marital status: Married    Spouse name: Not on file   Number of children: Not on file   Years of education: Not on file   Highest education level: Not on file  Occupational History   Not on file  Tobacco Use   Smoking status: Never   Smokeless tobacco: Never  Substance and Sexual Activity   Alcohol use: No   Drug use: No   Sexual activity: Not on file  Other Topics Concern   Not on file  Social History Narrative   Not on file   Social Determinants of Health   Financial Resource Strain: Not on file  Food Insecurity: Not on file  Transportation Needs: Not on file  Physical Activity: Not on file  Stress: Not on file  Social Connections: Not on file  Intimate Partner Violence: Not on file    Review of Systems Constitutional: Patient ***denies any unintentional weight loss or change  in strength lntegumentary: Patient ***denies any rashes or pruritus Eyes: Patient denies ***dry eyes ENT: Patient ***denies dry mouth Cardiovascular: Patient ***denies chest pain or syncope Respiratory: Patient ***denies shortness of breath Gastrointestinal: Patient ***denies nausea, vomiting, constipation, or diarrhea Musculoskeletal: Patient ***denies muscle cramps or weakness Neurologic: Patient ***denies convulsions or seizures Psychiatric: Patient ***denies memory problems Allergic/Immunologic: Patient ***denies recent allergic reaction(s) Hematologic/Lymphatic: Patient denies bleeding tendencies Endocrine: Patient ***denies heat/cold intolerance  GU: As per HPI.  OBJECTIVE There were no vitals filed for this visit. There is no height or weight on file to calculate BMI.  Physical Examination Constitutional: ***No obvious distress; patient is ***non-toxic appearing  Cardiovascular: ***No visible lower extremity edema.  Respiratory: The patient does ***not have audible wheezing/stridor; respirations do ***not appear labored  Gastrointestinal: Abdomen ***non-distended Musculoskeletal: ***Normal ROM of UEs  Skin: ***No obvious rashes/open sores  Neurologic: CN 2-12 grossly ***intact Psychiatric: Answered questions ***appropriately with ***normal affect  Hematologic/Lymphatic/Immunologic: ***No obvious bruises or sites of spontaneous bleeding  UA: ***negative / *** WBC/hpf, *** RBC/hpf, bacteria (***) PVR: *** ml  ASSESSMENT No diagnosis found. ***  Will plan for follow up in *** months / ***1 year or sooner if needed. Pt verbalized understanding and agreement. All questions were answered.  PLAN Advised the following: 1. *** 2. ***No follow-ups on file.  No orders of the defined types were placed in this encounter.   It has been explained that the patient is to follow regularly with their PCP in addition to all other providers involved in their care and to follow  instructions provided by these respective offices. Patient advised to contact urology clinic if any urologic-pertaining questions, concerns, new symptoms or problems arise in the interim period.  There are no Patient Instructions on file for this visit.  Electronically signed by:  Donnita Falls, FNP   10/08/22    1:50 PM

## 2022-10-09 ENCOUNTER — Telehealth: Payer: Self-pay

## 2022-10-09 NOTE — Telephone Encounter (Signed)
-----   Message from Donnita Falls sent at 10/08/2022  1:52 PM EDT ----- Please instruct patient to get blood work done at least 1 day prior to his f/u appt this Thursday. Thanks

## 2022-10-09 NOTE — Telephone Encounter (Signed)
Tried calling patient with no answer, left vm for return call

## 2022-10-10 NOTE — Telephone Encounter (Addendum)
Tried calling patient several time with no answer. Left voiced message for return call.

## 2022-10-11 ENCOUNTER — Ambulatory Visit: Payer: Medicare Other | Admitting: Urology

## 2022-10-11 DIAGNOSIS — E291 Testicular hypofunction: Secondary | ICD-10-CM

## 2022-10-11 DIAGNOSIS — N529 Male erectile dysfunction, unspecified: Secondary | ICD-10-CM

## 2022-10-11 DIAGNOSIS — N138 Other obstructive and reflux uropathy: Secondary | ICD-10-CM

## 2022-10-12 ENCOUNTER — Ambulatory Visit: Payer: Medicare Other | Admitting: Urology

## 2022-12-17 ENCOUNTER — Other Ambulatory Visit: Payer: Medicare Other

## 2022-12-21 ENCOUNTER — Ambulatory Visit: Payer: Medicare Other | Admitting: Urology

## 2023-01-17 ENCOUNTER — Encounter: Payer: Self-pay | Admitting: *Deleted

## 2023-02-21 ENCOUNTER — Encounter: Payer: Self-pay | Admitting: Urology

## 2023-03-01 ENCOUNTER — Ambulatory Visit: Payer: Medicare Other | Admitting: Urology

## 2023-04-17 ENCOUNTER — Other Ambulatory Visit: Payer: Medicare Other

## 2023-04-26 ENCOUNTER — Ambulatory Visit: Payer: Medicare Other | Admitting: Urology

## 2023-04-26 DIAGNOSIS — E291 Testicular hypofunction: Secondary | ICD-10-CM

## 2023-05-14 ENCOUNTER — Telehealth: Payer: Self-pay | Admitting: Urology

## 2023-05-14 NOTE — Telephone Encounter (Signed)
 Needs injection sent to [facility]

## 2023-05-14 NOTE — Telephone Encounter (Signed)
 Patient return call. Patient was made aware that he has not been seen since June of last year and will need an appointment to refill medication. Patient voiced that he just made appointment for August and he state's he no show his appointment due to his work scheduled and it slip his mind. Patient is made aware to keep appointment and a message will be sent to Dr. Claretta Croft to refill Testosterone . Verbalized understanding

## 2023-05-14 NOTE — Telephone Encounter (Signed)
Tried calling patient with no answer. Left vm for return call

## 2023-05-16 ENCOUNTER — Telehealth: Payer: Self-pay

## 2023-05-16 ENCOUNTER — Other Ambulatory Visit: Payer: Self-pay

## 2023-05-16 DIAGNOSIS — E291 Testicular hypofunction: Secondary | ICD-10-CM

## 2023-05-16 NOTE — Telephone Encounter (Signed)
 Pharmacy called requesting refill for pt okay to refill ? If so how many refills appt 08/05

## 2023-05-21 ENCOUNTER — Other Ambulatory Visit: Payer: Self-pay

## 2023-05-21 DIAGNOSIS — E291 Testicular hypofunction: Secondary | ICD-10-CM

## 2023-05-21 MED ORDER — AMBULATORY NON FORMULARY MEDICATION: 0.2000 mL | 5 refills | Status: DC | PRN

## 2023-05-22 ENCOUNTER — Other Ambulatory Visit: Payer: Self-pay

## 2023-05-22 DIAGNOSIS — E291 Testicular hypofunction: Secondary | ICD-10-CM

## 2023-05-22 MED ORDER — AMBULATORY NON FORMULARY MEDICATION: 0.2000 mL | 5 refills | Status: DC | PRN

## 2023-05-23 ENCOUNTER — Other Ambulatory Visit: Payer: Self-pay | Admitting: Urology

## 2023-06-18 ENCOUNTER — Encounter (INDEPENDENT_AMBULATORY_CARE_PROVIDER_SITE_OTHER): Payer: Self-pay | Admitting: *Deleted

## 2023-08-21 ENCOUNTER — Ambulatory Visit: Admitting: Urology

## 2023-10-11 ENCOUNTER — Telehealth: Payer: Self-pay

## 2023-10-11 ENCOUNTER — Other Ambulatory Visit: Payer: Self-pay

## 2023-10-11 DIAGNOSIS — E291 Testicular hypofunction: Secondary | ICD-10-CM

## 2023-10-11 MED ORDER — AMBULATORY NON FORMULARY MEDICATION: 0.2000 mL | 0 refills | Status: AC | PRN

## 2023-10-11 NOTE — Telephone Encounter (Signed)
  The patient requested a medication refill of PAPA/PHENTO/PROSTAGLANDIN E1 ( 1ML VIAL) 30MG -1MG -30MCGML INJECTABLE.   Patient would like the medication sent to   [facility] - [place], [place] - [address] Phone: [phone]  Fax: [phone]

## 2023-10-11 NOTE — Telephone Encounter (Signed)
 Called and lvm to c/b for any needed concerns or questions regarding Rx or to call his pharmacy

## 2023-10-30 ENCOUNTER — Other Ambulatory Visit: Payer: Self-pay | Admitting: Urology

## 2023-12-11 ENCOUNTER — Ambulatory Visit: Admitting: Urology

## 2023-12-11 DIAGNOSIS — E291 Testicular hypofunction: Secondary | ICD-10-CM

## 2023-12-18 ENCOUNTER — Encounter (INDEPENDENT_AMBULATORY_CARE_PROVIDER_SITE_OTHER): Payer: Self-pay | Admitting: *Deleted

## 2024-05-20 ENCOUNTER — Ambulatory Visit: Admitting: Urology
